# Patient Record
Sex: Female | Born: 1990
Health system: Southern US, Community
[De-identification: ages and names within clinical notes are randomized; demographics above are authoritative.]

## PROBLEM LIST (undated history)

## (undated) DIAGNOSIS — O418X90 Other specified disorders of amniotic fluid and membranes, unspecified trimester, not applicable or unspecified: Secondary | ICD-10-CM

## (undated) DIAGNOSIS — I951 Orthostatic hypotension: Secondary | ICD-10-CM

## (undated) DIAGNOSIS — G90A Postural orthostatic tachycardia syndrome (POTS): Secondary | ICD-10-CM

## (undated) DIAGNOSIS — O468X9 Other antepartum hemorrhage, unspecified trimester: Secondary | ICD-10-CM

## (undated) DIAGNOSIS — I498 Other specified cardiac arrhythmias: Secondary | ICD-10-CM

## (undated) DIAGNOSIS — E739 Lactose intolerance, unspecified: Secondary | ICD-10-CM

## (undated) DIAGNOSIS — N809 Endometriosis, unspecified: Secondary | ICD-10-CM

## (undated) DIAGNOSIS — G43909 Migraine, unspecified, not intractable, without status migrainosus: Secondary | ICD-10-CM

## (undated) DIAGNOSIS — R000 Tachycardia, unspecified: Secondary | ICD-10-CM

## (undated) DIAGNOSIS — M549 Dorsalgia, unspecified: Secondary | ICD-10-CM

## (undated) DIAGNOSIS — D649 Anemia, unspecified: Secondary | ICD-10-CM

## (undated) HISTORY — DX: Lactose intolerance, unspecified: E73.9

## (undated) HISTORY — PX: LAPAROSCOPIC OVARIAN CYSTECTOMY: SUR786

## (undated) HISTORY — DX: Other specified disorders of amniotic fluid and membranes, unspecified trimester, not applicable or unspecified: O41.8X90

## (undated) HISTORY — DX: Other antepartum hemorrhage, unspecified trimester: O46.8X9

## (undated) HISTORY — DX: Anemia, unspecified: D64.9

## (undated) HISTORY — DX: Postural orthostatic tachycardia syndrome (POTS): G90.A

## (undated) HISTORY — DX: Orthostatic hypotension: I95.1

## (undated) HISTORY — DX: Other specified cardiac arrhythmias: I49.8

## (undated) HISTORY — DX: Migraine, unspecified, not intractable, without status migrainosus: G43.909

## (undated) HISTORY — DX: Tachycardia, unspecified: R00.0

## (undated) HISTORY — PX: ENDOMETRIAL ABLATION: SHX621

## (undated) HISTORY — DX: Dorsalgia, unspecified: M54.9

---
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
## 2014-07-05 NOTE — L&D Delivery Note (Signed)
Patient was C/C/+1 and pushed for 56 minutes with epidural.   NSVD female infant, Apgars 8/9, weight pending.   The patient had a 1st degree laceration repaired with 3-0 vicryl. Fundus was firm. EBL was expected amount. Placenta was delivered intact. Vagina was clear.  Baby was vigorous and doing skin to skin with mother.  Jean AspenALLAHAN, Jean Phillips

## 2015-01-27 LAB — OB RESULTS CONSOLE RUBELLA ANTIBODY, IGM: RUBELLA: IMMUNE

## 2015-01-27 LAB — OB RESULTS CONSOLE RPR: RPR: NONREACTIVE

## 2015-01-27 LAB — OB RESULTS CONSOLE GC/CHLAMYDIA
CHLAMYDIA, DNA PROBE: NEGATIVE
GC PROBE AMP, GENITAL: NEGATIVE

## 2015-01-27 LAB — OB RESULTS CONSOLE ABO/RH: RH Type: POSITIVE

## 2015-01-27 LAB — OB RESULTS CONSOLE HIV ANTIBODY (ROUTINE TESTING): HIV: NONREACTIVE

## 2015-01-27 LAB — OB RESULTS CONSOLE HEPATITIS B SURFACE ANTIGEN: Hepatitis B Surface Ag: NEGATIVE

## 2015-01-27 LAB — OB RESULTS CONSOLE ANTIBODY SCREEN: Antibody Screen: NEGATIVE

## 2015-05-14 DIAGNOSIS — O468X9 Other antepartum hemorrhage, unspecified trimester: Secondary | ICD-10-CM

## 2015-05-14 DIAGNOSIS — O418X90 Other specified disorders of amniotic fluid and membranes, unspecified trimester, not applicable or unspecified: Secondary | ICD-10-CM | POA: Insufficient documentation

## 2015-05-14 DIAGNOSIS — I951 Orthostatic hypotension: Secondary | ICD-10-CM | POA: Insufficient documentation

## 2015-05-15 ENCOUNTER — Encounter: Payer: Self-pay | Admitting: Cardiovascular Disease

## 2015-05-15 ENCOUNTER — Ambulatory Visit (INDEPENDENT_AMBULATORY_CARE_PROVIDER_SITE_OTHER): Payer: Medicaid Other | Admitting: Cardiovascular Disease

## 2015-05-15 VITALS — BP 112/70 | HR 126 | Ht 63.5 in | Wt 205.0 lb

## 2015-05-15 DIAGNOSIS — I951 Orthostatic hypotension: Secondary | ICD-10-CM | POA: Diagnosis not present

## 2015-05-15 MED ORDER — LABETALOL HCL 100 MG PO TABS: 50.0000 mg | ORAL_TABLET | Freq: Two times a day (BID) | ORAL | Status: DC

## 2015-05-15 MED ORDER — METOPROLOL TARTRATE 25 MG PO TABS: 12.5000 mg | ORAL_TABLET | Freq: Two times a day (BID) | ORAL | Status: DC

## 2015-05-15 NOTE — Patient Instructions (Addendum)
Medication Instructions:  START Metoprolol 12.5 mg twice daily - take 12 hours apart - wait for phone call from our office before starting medication  **CALLED PATIENT BACK AFTER DR. Elease HashimotoNAHSER TALKED TO OB/GYN AND RX CHANGED TO LABETALOL 50 MG TWICE DAILY   Labwork: None Ordered   Testing/Procedures: None Ordered   Follow-Up: Your physician wants you to follow-up in: 6 months with Dr. Elease HashimotoNahser.  You will receive a reminder letter in the mail two months in advance. If you don't receive a letter, please call our office to schedule the follow-up appointment.  Your physician has requested that you regularly monitor and record your blood pressure readings at home. Please use the same machine at the same time of day to check your readings and call Marcelino DusterMichelle, RN to report    Thank you for choosing [facility]! Eligha BridegroomMichelle Violetta Lavalle, RN [phone]  If you need a refill on your cardiac medications before your next appointment, please call your pharmacy.

## 2015-05-15 NOTE — Progress Notes (Signed)
Cardiology Office Note   Date:  05/15/2015   ID:  Jean Phillips Roussel, DOB 1991/01/03, MRN [id number]  PCP:  No primary care provider on file.  Cardiologist:   Dyanne Yorks, Deloris PingPhilip J, MD   Chief Complaint  Patient presents with  . Tachycardia   Problem list 1. Sinus tachycardia-with pregnancy 2. POTS  - was well controlled on metoprolol .     History of Present Illness: Jean Phillips Deacon is a 24 y.o. female who presents for sinus tachycardia .  She is 8 months pregnant.  Has a hx of POTS syndrome.  Took metoprolol in the past with success.  Stopped   It July 2015 due to lack of insurance.   Otherwise doing great.  Some fatigue .   Was diagnosed with POTS in Dec. 2014.     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 Orthostatic hypotension   . Subchorionic hematoma     No past surgical history on file.   Current Outpatient Prescriptions  Medication Sig Dispense Refill  . Calcium Carbonate Antacid (TUMS PO) Take by mouth.    . Prenatal Vit-Fe Fumarate-FA (PRENATAL PO) Take by mouth.    . Pseudoephedrine HCl (SUDAFED PO) Take by mouth.     No current facility-administered medications for this visit.    Allergies:   Review of patient's allergies indicates no known allergies.    Social History:  The patient  reports that she has quit smoking. She does not have any smokeless tobacco history on file.   Family History:  The patient's family history includes Diabetes in her maternal grandfather and maternal grandmother; Hypertension in her father.    ROS:  Please see the history of present illness.    Review of Systems: Constitutional:  denies fever, chills, diaphoresis, appetite change and fatigue.  HEENT: denies photophobia, eye pain, redness, hearing loss, ear pain, congestion, sore throat, rhinorrhea, sneezing, neck pain, neck stiffness and tinnitus.  Respiratory: denies SOB, DOE,  cough, chest tightness, and wheezing.  Cardiovascular: denies chest pain, palpitations and leg swelling.  Gastrointestinal: denies nausea, vomiting, abdominal pain, diarrhea, constipation, blood in stool.  Genitourinary: denies dysuria, urgency, frequency, hematuria, flank pain and difficulty urinating.  Musculoskeletal: denies  myalgias, back pain, joint swelling, arthralgias and gait problem.   Skin: denies pallor, rash and wound.  Neurological: denies dizziness, seizures, syncope, weakness, light-headedness, numbness and headaches.   Hematological: denies adenopathy, easy bruising, personal or family bleeding history.  Psychiatric/ Behavioral: denies suicidal ideation, mood changes, confusion, nervousness, sleep disturbance and agitation.       All other systems are reviewed and negative.    PHYSICAL EXAM: VS:  BP 112/70 mmHg  Pulse 126  Ht 5' 3.5" (1.613 m)  Wt 205 lb (92.987 kg)  BMI 35.74 kg/m2 , BMI Body mass index is 35.74 kg/(m^2). GEN: Well nourished, well developed, in no acute distress HEENT: normal Neck: no JVD, carotid bruits, or masses Cardiac: RRR;  Tachycardic,  no murmurs, rubs, or gallops,no edema  Respiratory:  clear to auscultation bilaterally, normal work of breathing GI: soft, nontender, nondistended, + BS MS: no deformity or atrophy Skin: warm and dry, no rash Neuro:  Strength and sensation are intact Psych: normal   EKG:  EKG is ordered today. The ekg ordered today demonstrates sinus tach at 126.  No ST or T wave changes  Recent Labs: No results found for requested labs within last 365 days.    Lipid Panel No results found for: CHOL, TRIG, HDL, CHOLHDL, VLDL, LDLCALC, LDLDIRECT    Wt Readings from Last 3 Encounters:  05/15/15 205 lb (92.987 kg)      Other studies Reviewed: Additional studies/ records that were reviewed today include: . Review of the above records demonstrates:    ASSESSMENT AND PLAN:  1.  Sinus tachycardia:  Eeva  has a hx of POTS and now has tachycardia in her 8th month of pregnancy. I  I discussed with the OB - GYN doctor on call today. We discussed the options of metoprolol versus little low. Her OB/GYN would prefer that we try labetalol first since it has a longer track record in  Pregnancy. We'll start with Labetolol 50 mg twice a day. She will be seeing her OB doctor regularly    Current medicines are reviewed at length with the patient today.  The patient does not have concerns regarding medicines.  The following changes have been made:  no change  Labs/ tests ordered today include:  No orders of the defined types were placed in this encounter.     Disposition:   FU with me 6 months      Eura Radabaugh, Deloris Ping, MD  05/15/2015 9:20 AM    [facility] [address] [place], [place], [place]  [address] Phone: [phone]; Fax: [phone]   [facility]  [address] [place], [place]  [address] [phone]   Fax [phone]

## 2015-05-23 ENCOUNTER — Other Ambulatory Visit: Payer: Self-pay | Admitting: Obstetrics and Gynecology

## 2015-06-02 LAB — OB RESULTS CONSOLE GBS: STREP GROUP B AG: NEGATIVE

## 2015-06-18 ENCOUNTER — Inpatient Hospital Stay (EMERGENCY_DEPARTMENT_HOSPITAL)
Admission: AD | Admit: 2015-06-18 | Discharge: 2015-06-18 | Disposition: A | Discharge status: Home / Self Care | Payer: Medicaid Other | Source: Ambulatory Visit | Attending: Obstetrics and Gynecology | Admitting: Obstetrics and Gynecology

## 2015-06-18 ENCOUNTER — Encounter (HOSPITAL_COMMUNITY): Payer: Self-pay | Admitting: *Deleted

## 2015-06-18 DIAGNOSIS — O471 False labor at or after 37 completed weeks of gestation: Secondary | ICD-10-CM | POA: Diagnosis not present

## 2015-06-18 DIAGNOSIS — O479 False labor, unspecified: Secondary | ICD-10-CM

## 2015-06-18 HISTORY — DX: Endometriosis, unspecified: N80.9

## 2015-06-18 NOTE — MAU Note (Signed)
Had been having contractions but states that "they have stopped now"; had some vaginal leaking but unsure if its her bladder leaking or SROM;

## 2015-06-18 NOTE — MAU Provider Note (Signed)
Chief Complaint:  Labor Eval   None     HPI: Jean Phillips is a 24 y.o. G1P0 at 6646w5d who presents to maternity admissions reporting cramping yesterday and this morning that resolved but with onset of leakage of clear fluid, enough to soak a panty liner but not a pad this afternoon.  She passed her mucus plug yesterday and is seeing mucus discharge also, but some leakage is also watery.  Cramping/contractions were regular and painful, lasting several hours, but not worsening over time.  She did not try any medications for the discomfort and it was only mildly relieved with position change, drinking fluids, and warm shower.  The contractions resolved spontaneously this afternoon. She reports good fetal movement, denies vaginal bleeding, vaginal itching/burning, urinary symptoms, h/a, dizziness, n/v, or fever/chills.    HPI  Past Medical History: Past Medical History  Diagnosis Date  . Orthostatic hypotension   . Subchorionic hematoma   . Endometriosis     Past obstetric history: OB History  Gravida Para Term Preterm AB SAB TAB Ectopic Multiple Living  1             # Outcome Date GA Lbr Len/2nd Weight Sex Delivery Anes PTL Lv  1 Current               Past Surgical History: Past Surgical History  Procedure Laterality Date  . Laparoscopic ovarian cystectomy    . Endometrial ablation      Family History: Family History  Problem Relation Age of Onset  . Diabetes Maternal Grandmother   . Diabetes Maternal Grandfather   . Hypertension Father     Social History: Social History  Substance Use Topics  . Smoking status: Former Games developermoker  . Smokeless tobacco: None  . Alcohol Use: None    Allergies: No Known Allergies  Meds:  No prescriptions prior to admission    ROS:  Review of Systems  Constitutional: Negative for fever, chills and fatigue.  HENT: Negative for sinus pressure.   Eyes: Negative for photophobia.  Respiratory: Negative for shortness of breath.    Cardiovascular: Negative for chest pain.  Gastrointestinal: Negative for nausea, vomiting, diarrhea and constipation.  Genitourinary: Negative for dysuria, frequency, flank pain, vaginal bleeding, vaginal discharge, difficulty urinating, vaginal pain and pelvic pain.  Musculoskeletal: Negative for neck pain.  Neurological: Negative for dizziness, weakness and headaches.  Psychiatric/Behavioral: Negative.      I have reviewed patient's Past Medical Hx, Surgical Hx, Family Hx, Social Hx, medications and allergies.   Physical Exam   Patient Vitals for the past 24 hrs:  BP Temp Temp src Pulse Resp  06/18/15 1729 125/75 mmHg - - 96 18  06/18/15 1640 137/88 mmHg 97.7 F (36.5 C) Oral (!) 127 18   Constitutional: Well-developed, well-nourished female in no acute distress.  Cardiovascular: normal rate Respiratory: normal effort GI: Abd soft, non-tender, gravid appropriate for gestational age.  MS: Extremities nontender, no edema, normal ROM Neurologic: Alert and oriented x 4.  GU: Neg CVAT.  PELVIC EXAM: Cervix pink, visually closed, without lesion, large amount milky white thick fluid, no pooling of liquid, slide taken for ferning, vaginal walls and external genitalia normal  Dilation: 1 Effacement (%): 60 Station: -3 Presentation: Vertex Exam by:: L Leftwich-Kirby CNM   Ferning slide negative  FHT:  Baseline 135, moderate variability, accelerations present, no decelerations Contractions: Rare on toco, mild to palpation   Labs: No results found for this or any previous visit (from the past 24 hour(s)).  Imaging:  No results found.  MAU Course/MDM: I have ordered labs and reviewed results.  Reviewed FHR tracing.  No evidence of ruptured membranes or active labor on today's exam.  Consult Dr Tenny Craw.  Term labor precautions given. Pt stable at time of discharge.  Assessment: 1. Threatened labor at term     Plan: Discharge home Labor precautions and fetal kick counts      Follow-up Information    Follow up with Almon Hercules., MD.   Specialty:  Obstetrics and Gynecology   Why:  As scheduled, Return to MAU as needed for emergencies   Contact information:   [address] [place] [address] [phone]        Medication List    STOP taking these medications        labetalol 100 MG tablet  Commonly known as:  NORMODYNE      TAKE these medications        guaifenesin 100 MG/5ML syrup  Commonly known as:  ROBITUSSIN  Take 200 mg by mouth 3 (three) times daily as needed for cough.     PRENATAL PO  Take by mouth.     TUMS PO  Take 2 tablets by mouth as needed (heartburn).        Sharen Counter Certified Nurse-Midwife 06/18/2015 6:21 PM

## 2015-06-18 NOTE — MAU Note (Signed)
Cramping started 2 nights ago, became more regular yesterday, still cramping today.  Has ? LOF, dripping occasionally, clear fluid.  Denies bleeding.

## 2015-06-18 NOTE — Discharge Instructions (Signed)

## 2015-06-19 ENCOUNTER — Inpatient Hospital Stay (HOSPITAL_COMMUNITY)
Admission: AD | Admit: 2015-06-19 | Discharge: 2015-06-21 | DRG: 775 | Disposition: A | Discharge status: Home / Self Care | Payer: Medicaid Other | Source: Ambulatory Visit | Attending: Obstetrics and Gynecology | Admitting: Obstetrics and Gynecology

## 2015-06-19 ENCOUNTER — Inpatient Hospital Stay (HOSPITAL_COMMUNITY): Payer: Medicaid Other | Admitting: Anesthesiology

## 2015-06-19 ENCOUNTER — Encounter (HOSPITAL_COMMUNITY): Payer: Self-pay | Admitting: *Deleted

## 2015-06-19 DIAGNOSIS — Z87891 Personal history of nicotine dependence: Secondary | ICD-10-CM

## 2015-06-19 DIAGNOSIS — Z6837 Body mass index (BMI) 37.0-37.9, adult: Secondary | ICD-10-CM | POA: Diagnosis not present

## 2015-06-19 DIAGNOSIS — Z3A39 39 weeks gestation of pregnancy: Secondary | ICD-10-CM | POA: Diagnosis not present

## 2015-06-19 DIAGNOSIS — O99214 Obesity complicating childbirth: Secondary | ICD-10-CM | POA: Diagnosis present

## 2015-06-19 LAB — CBC
HCT: 39.1 % (ref 36.0–46.0)
Hemoglobin: 13.3 g/dL (ref 12.0–15.0)
MCH: 30.7 pg (ref 26.0–34.0)
MCHC: 34 g/dL (ref 30.0–36.0)
MCV: 90.3 fL (ref 78.0–100.0)
Platelets: 279 10*3/uL (ref 150–400)
RBC: 4.33 MIL/uL (ref 3.87–5.11)
RDW: 14.3 % (ref 11.5–15.5)
WBC: 15.7 10*3/uL — ABNORMAL HIGH (ref 4.0–10.5)

## 2015-06-19 LAB — TYPE AND SCREEN
ABO/RH(D): A POS
Antibody Screen: NEGATIVE

## 2015-06-19 LAB — RPR: RPR Ser Ql: NONREACTIVE

## 2015-06-19 LAB — POCT FERN TEST: POCT FERN TEST: POSITIVE

## 2015-06-19 LAB — ABO/RH: ABO/RH(D): A POS

## 2015-06-19 MED ORDER — PRENATAL MULTIVITAMIN CH
1.0000 | ORAL_TABLET | Freq: Every day | ORAL | Status: DC
  Administered 2015-06-20 – 2015-06-21 (×2): 1 via ORAL
  Filled 2015-06-19 (×2): qty 1

## 2015-06-19 MED ORDER — SENNOSIDES-DOCUSATE SODIUM 8.6-50 MG PO TABS
2.0000 | ORAL_TABLET | ORAL | Status: DC
  Administered 2015-06-19 – 2015-06-20 (×2): 2 via ORAL
  Filled 2015-06-19 (×2): qty 2

## 2015-06-19 MED ORDER — FLEET ENEMA 7-19 GM/118ML RE ENEM: 1.0000 | ENEMA | RECTAL | Status: DC | PRN

## 2015-06-19 MED ORDER — PHENYLEPHRINE 40 MCG/ML (10ML) SYRINGE FOR IV PUSH (FOR BLOOD PRESSURE SUPPORT)
80.0000 ug | PREFILLED_SYRINGE | INTRAVENOUS | Status: DC | PRN
  Filled 2015-06-19: qty 20
  Filled 2015-06-19: qty 2

## 2015-06-19 MED ORDER — FENTANYL 2.5 MCG/ML BUPIVACAINE 1/10 % EPIDURAL INFUSION (WH - ANES): 14.0000 mL/h | INTRAMUSCULAR | Status: DC | PRN

## 2015-06-19 MED ORDER — ONDANSETRON HCL 4 MG/2ML IJ SOLN
4.0000 mg | Freq: Four times a day (QID) | INTRAMUSCULAR | Status: DC | PRN
Start: 2015-06-19 — End: 2015-06-19
  Administered 2015-06-19: 4 mg via INTRAVENOUS
  Filled 2015-06-19: qty 2

## 2015-06-19 MED ORDER — OXYCODONE-ACETAMINOPHEN 5-325 MG PO TABS: 2.0000 | ORAL_TABLET | ORAL | Status: DC | PRN

## 2015-06-19 MED ORDER — ONDANSETRON HCL 4 MG PO TABS: 4.0000 mg | ORAL_TABLET | ORAL | Status: DC | PRN

## 2015-06-19 MED ORDER — LACTATED RINGERS IV SOLN
500.0000 mL | INTRAVENOUS | Status: DC | PRN
  Administered 2015-06-19 (×2): 500 mL via INTRAVENOUS

## 2015-06-19 MED ORDER — DIBUCAINE 1 % RE OINT: 1.0000 "application " | TOPICAL_OINTMENT | RECTAL | Status: DC | PRN

## 2015-06-19 MED ORDER — EPHEDRINE 5 MG/ML INJ
10.0000 mg | INTRAVENOUS | Status: DC | PRN
  Filled 2015-06-19: qty 2

## 2015-06-19 MED ORDER — SIMETHICONE 80 MG PO CHEW: 80.0000 mg | CHEWABLE_TABLET | ORAL | Status: DC | PRN

## 2015-06-19 MED ORDER — OXYCODONE-ACETAMINOPHEN 5-325 MG PO TABS: 1.0000 | ORAL_TABLET | ORAL | Status: DC | PRN

## 2015-06-19 MED ORDER — OXYTOCIN BOLUS FROM INFUSION
500.0000 mL | INTRAVENOUS | Status: DC
  Administered 2015-06-19: 500 mL via INTRAVENOUS

## 2015-06-19 MED ORDER — FENTANYL 2.5 MCG/ML BUPIVACAINE 1/10 % EPIDURAL INFUSION (WH - ANES)
14.0000 mL/h | INTRAMUSCULAR | Status: DC | PRN
  Administered 2015-06-19: 14 mL/h via EPIDURAL
  Filled 2015-06-19 (×2): qty 125

## 2015-06-19 MED ORDER — ONDANSETRON HCL 4 MG/2ML IJ SOLN: 4.0000 mg | INTRAMUSCULAR | Status: DC | PRN

## 2015-06-19 MED ORDER — FENTANYL 2.5 MCG/ML BUPIVACAINE 1/10 % EPIDURAL INFUSION (WH - ANES)
INTRAMUSCULAR | Status: DC | PRN
  Administered 2015-06-19: 14 mL/h via EPIDURAL

## 2015-06-19 MED ORDER — LACTATED RINGERS IV SOLN
INTRAVENOUS | Status: DC
  Administered 2015-06-19 (×2): via INTRAVENOUS

## 2015-06-19 MED ORDER — ZOLPIDEM TARTRATE 5 MG PO TABS: 5.0000 mg | ORAL_TABLET | Freq: Every evening | ORAL | Status: DC | PRN

## 2015-06-19 MED ORDER — OXYTOCIN 40 UNITS IN LACTATED RINGERS INFUSION - SIMPLE MED
62.5000 mL/h | INTRAVENOUS | Status: DC
  Filled 2015-06-19: qty 1000

## 2015-06-19 MED ORDER — BENZOCAINE-MENTHOL 20-0.5 % EX AERO
1.0000 "application " | INHALATION_SPRAY | CUTANEOUS | Status: DC | PRN
  Administered 2015-06-19 – 2015-06-21 (×2): 1 via TOPICAL
  Filled 2015-06-19 (×2): qty 56

## 2015-06-19 MED ORDER — DIPHENHYDRAMINE HCL 25 MG PO CAPS: 25.0000 mg | ORAL_CAPSULE | Freq: Four times a day (QID) | ORAL | Status: DC | PRN

## 2015-06-19 MED ORDER — INFLUENZA VAC SPLIT QUAD 0.5 ML IM SUSY
0.5000 mL | PREFILLED_SYRINGE | INTRAMUSCULAR | Status: AC
  Administered 2015-06-20: 0.5 mL via INTRAMUSCULAR
  Filled 2015-06-19: qty 0.5

## 2015-06-19 MED ORDER — WITCH HAZEL-GLYCERIN EX PADS: 1.0000 "application " | MEDICATED_PAD | CUTANEOUS | Status: DC | PRN

## 2015-06-19 MED ORDER — CITRIC ACID-SODIUM CITRATE 334-500 MG/5ML PO SOLN: 30.0000 mL | ORAL | Status: DC | PRN

## 2015-06-19 MED ORDER — LIDOCAINE HCL (PF) 1 % IJ SOLN
30.0000 mL | INTRAMUSCULAR | Status: DC | PRN
  Administered 2015-06-19: 30 mL via SUBCUTANEOUS
  Filled 2015-06-19: qty 30

## 2015-06-19 MED ORDER — LIDOCAINE HCL (PF) 1 % IJ SOLN
INTRAMUSCULAR | Status: DC | PRN
  Administered 2015-06-19 (×2): 5 mL

## 2015-06-19 MED ORDER — LIDOCAINE HCL (PF) 1 % IJ SOLN: INTRAMUSCULAR | Status: DC | PRN

## 2015-06-19 MED ORDER — TETANUS-DIPHTH-ACELL PERTUSSIS 5-2.5-18.5 LF-MCG/0.5 IM SUSP: 0.5000 mL | Freq: Once | INTRAMUSCULAR | Status: DC

## 2015-06-19 MED ORDER — ACETAMINOPHEN 325 MG PO TABS: 650.0000 mg | ORAL_TABLET | ORAL | Status: DC | PRN

## 2015-06-19 MED ORDER — IBUPROFEN 600 MG PO TABS
600.0000 mg | ORAL_TABLET | Freq: Four times a day (QID) | ORAL | Status: DC
  Administered 2015-06-19 – 2015-06-21 (×8): 600 mg via ORAL
  Filled 2015-06-19 (×8): qty 1

## 2015-06-19 MED ORDER — DIPHENHYDRAMINE HCL 50 MG/ML IJ SOLN: 12.5000 mg | INTRAMUSCULAR | Status: DC | PRN

## 2015-06-19 MED ORDER — LANOLIN HYDROUS EX OINT: TOPICAL_OINTMENT | CUTANEOUS | Status: DC | PRN

## 2015-06-19 NOTE — Anesthesia Procedure Notes (Signed)
Epidural Patient location during procedure: OB Start time: 06/19/2015 7:41 AM End time: 06/19/2015 7:59 AM  Staffing Anesthesiologist: Sebastian AcheMANNY, Cathren Sween  Preanesthetic Checklist Completed: patient identified, site marked, surgical consent, pre-op evaluation, timeout performed, IV checked, risks and benefits discussed and monitors and equipment checked  Epidural Patient position: sitting Prep: site prepped and draped and DuraPrep Patient monitoring: heart rate, continuous pulse ox and blood pressure Approach: midline Location: L3-L4 Injection technique: LOR air  Needle:  Needle type: Tuohy  Needle gauge: 17 G Needle length: 9 cm and 9 Needle insertion depth: 6 cm Catheter type: closed end flexible Catheter size: 19 Gauge Catheter at skin depth: 14 cm Test dose: negative  Assessment Events: blood not aspirated, injection not painful, no injection resistance, negative IV test and no paresthesia  Additional Notes   Patient tolerated the insertion well without complications.Reason for block:procedure for pain

## 2015-06-19 NOTE — H&P (Signed)
24 y.o. 7440w6d  G1P0 comes in c/o SROM approx 3:45am today.  Otherwise has good fetal movement and no bleeding.  Past Medical History  Diagnosis Date  . Orthostatic hypotension   . Subchorionic hematoma   . Endometriosis     Past Surgical History  Procedure Laterality Date  . Laparoscopic ovarian cystectomy    . Endometrial ablation      OB History  Gravida Para Term Preterm AB SAB TAB Ectopic Multiple Living  1             # Outcome Date GA Lbr Len/2nd Weight Sex Delivery Anes PTL Lv  1 Current               Social History   Social History  . Marital Status: Married    Spouse Name: N/A  . Number of Children: N/A  . Years of Education: N/A   Occupational History  . Not on file.   Social History Main Topics  . Smoking status: Former Games developermoker  . Smokeless tobacco: Not on file  . Alcohol Use: No  . Drug Use: No  . Sexual Activity: Not on file   Other Topics Concern  . Not on file   Social History Narrative   Review of patient's allergies indicates no known allergies.    Prenatal Transfer Tool  Maternal Diabetes: No Genetic Screening: Normal Maternal Ultrasounds/Referrals: Normal Fetal Ultrasounds or other Referrals:  None Maternal Substance Abuse:  No Significant Maternal Medications:  None Significant Maternal Lab Results: Lab values include: Group B Strep negative  Other PNC: POTS, s/p cards eval while pregnant, declined starting labetalol 50bid   Filed Vitals:   06/19/15 0817 06/19/15 0832  BP: 124/93 123/82  Pulse: 108 97  Temp:    Resp:  16     Lungs/Cor:  NAD Abdomen:  soft, gravid Ex:  no cords, erythema SVE:  4.5/80/02 FHTs:  135, good STV, NST R Toco:  q2-4  A/P   Admit in labor  GBS Neg  Epidural as desired  Routine care  [place]AN, Luther ParodySIDNEY

## 2015-06-19 NOTE — Lactation Note (Signed)
This note was copied from the chart of Boy Cathe MonsSarah [place]. Lactation Consultation Note  Patient Name: Boy Cathe MonsSarah Cambridge EXBMW'UToday's Date: 06/19/2015 Reason for consult: Initial assessment Baby at 6 hr of life and mom reports bf is going well. Discussed baby behavior, feeding frequency, baby belly size, voids, wt loss, breast changes, and nipple care. Demonstrated manual expression, but mom may need more teaching. Given lactation handouts. Aware of OP services and support group.     Maternal Data Has patient been taught Hand Expression?: Yes Does the patient have breastfeeding experience prior to this delivery?: No  Feeding Feeding Type: Breast Fed Length of feed: 15 min  LATCH Score/Interventions Latch: Repeated attempts needed to sustain latch, nipple held in mouth throughout feeding, stimulation needed to elicit sucking reflex. Intervention(s): Breast compression  Audible Swallowing: Spontaneous and intermittent  Type of Nipple: Everted at rest and after stimulation  Comfort (Breast/Nipple): Soft / non-tender     Hold (Positioning): No assistance needed to correctly position infant at breast. Intervention(s): Support Pillows;Skin to skin  LATCH Score: 9  Lactation Tools Discussed/Used WIC Program: Yes   Consult Status Consult Status: Follow-up Date: 06/20/15 Follow-up type: In-patient    Rulon Eisenmengerlizabeth E Marlicia Sroka 06/19/2015, 9:05 PM

## 2015-06-19 NOTE — MAU Note (Signed)
Pt states water broke at 0345-has continued to leak out since. Clear fluid. Contractions every 2-3 mins apart. +FM tonight. Denies vag bleeding

## 2015-06-19 NOTE — Anesthesia Preprocedure Evaluation (Signed)
Anesthesia Evaluation  Patient identified by MRN, date of birth, ID band Patient awake    Reviewed: Allergy & Precautions, NPO status , Patient's Chart, lab work & pertinent test results  Airway Mallampati: II   Neck ROM: Full    Dental  (+) Teeth Intact   Pulmonary neg pulmonary ROS, former smoker,    breath sounds clear to auscultation       Cardiovascular negative cardio ROS   Rhythm:Irregular     Neuro/Psych negative neurological ROS  negative psych ROS   GI/Hepatic negative GI ROS, Neg liver ROS,   Endo/Other  negative endocrine ROSMorbid obesityBMI 36  Renal/GU negative Renal ROS  negative genitourinary   Musculoskeletal negative musculoskeletal ROS (+)   Abdominal (+)  Abdomen: soft.    Peds negative pediatric ROS (+)  Hematology negative hematology ROS (+)   Anesthesia Other Findings   Reproductive/Obstetrics negative OB ROS (+) Pregnancy                             Anesthesia Physical Anesthesia Plan  ASA: II  Anesthesia Plan: Epidural   Post-op Pain Management:    Induction:   Airway Management Planned:   Additional Equipment:   Intra-op Plan:   Post-operative Plan:   Informed Consent: I have reviewed the patients History and Physical, chart, labs and discussed the procedure including the risks, benefits and alternatives for the proposed anesthesia with the patient or authorized representative who has indicated his/her understanding and acceptance.     Plan Discussed with:   Anesthesia Plan Comments:         Anesthesia Quick Evaluation

## 2015-06-20 LAB — CBC
HCT: 32.8 % — ABNORMAL LOW (ref 36.0–46.0)
Hemoglobin: 10.8 g/dL — ABNORMAL LOW (ref 12.0–15.0)
MCH: 30.3 pg (ref 26.0–34.0)
MCHC: 32.9 g/dL (ref 30.0–36.0)
MCV: 92.1 fL (ref 78.0–100.0)
PLATELETS: 221 10*3/uL (ref 150–400)
RBC: 3.56 MIL/uL — ABNORMAL LOW (ref 3.87–5.11)
RDW: 14.6 % (ref 11.5–15.5)
WBC: 13.8 10*3/uL — AB (ref 4.0–10.5)

## 2015-06-20 NOTE — Anesthesia Postprocedure Evaluation (Signed)
Anesthesia Post Note  Patient: Jean Phillips  Procedure(s) Performed: * No procedures listed *  Patient location during evaluation: Mother Baby Anesthesia Type: Epidural Level of consciousness: awake, awake and alert, oriented and patient cooperative Pain management: pain level controlled Vital Signs Assessment: post-procedure vital signs reviewed and stable Respiratory status: spontaneous breathing, nonlabored ventilation and respiratory function stable Cardiovascular status: stable Postop Assessment: no headache, no backache, patient able to bend at knees and no signs of nausea or vomiting Anesthetic complications: no    Last Vitals:  Filed Vitals:   06/19/15 2126 06/20/15 0509  BP: 119/65 110/63  Pulse: 91 85  Temp: 36.7 C 36.6 C  Resp: 18 20    Last Pain:  Filed Vitals:   06/20/15 0509  PainSc: 4                  Nakyia Dau L

## 2015-06-20 NOTE — Lactation Note (Signed)
This note was copied from the chart of Jean Phillips Wiesen. Lactation Consultation Note  Patient Name: Jean Phillips Leidy ZOXWR'UToday's Date: 06/20/2015 Reason for consult: Follow-up assessment Baby at 24 hr of life and mom reports that he has not been feeding well. Had mom unwrap baby and get him sts in the football position. Baby was go on the breast and mom would state that he was pinching. Got baby to breast comfortably in a laid back position. Baby was on the breast for 20 minutes with audible swallows. Discussed baby behavior, feeding frequency, baby belly size, voids, breast changes, and nipple care. She will offer the breast 8+/24hr and call for help as needed. She is aware of OP services and support group.    Maternal Data    Feeding Feeding Type: Breast Fed Length of feed: 20 min  LATCH Score/Interventions Latch: Grasps breast easily, tongue down, lips flanged, rhythmical sucking. Intervention(s): Adjust position;Assist with latch;Breast compression  Audible Swallowing: Spontaneous and intermittent  Type of Nipple: Everted at rest and after stimulation  Comfort (Breast/Nipple): Soft / non-tender     Hold (Positioning): Assistance needed to correctly position infant at breast and maintain latch. Intervention(s): Support Pillows;Position options  LATCH Score: 9  Lactation Tools Discussed/Used     Consult Status Consult Status: Follow-up Date: 06/21/15 Follow-up type: In-patient    Rulon Eisenmengerlizabeth E Trampus Mcquerry 06/20/2015, 4:15 PM

## 2015-06-20 NOTE — Progress Notes (Signed)
Patient is eating, ambulating, voiding.  Pain control is good.  Filed Vitals:   06/19/15 1715 06/19/15 1823 06/19/15 2126 06/20/15 0509  BP: 131/67 127/74 119/65 110/63  Pulse: 124 108 91 85  Temp: 98.2 F (36.8 C) 98.3 F (36.8 C) 98 F (36.7 C) 97.8 F (36.6 C)  TempSrc: Oral Oral    Resp: 20 20 18 20   Height:      Weight:      SpO2:        Fundus firm Perineum without swelling.  Lab Results  Component Value Date   WBC 13.8* 06/20/2015   HGB 10.8* 06/20/2015   HCT 32.8* 06/20/2015   MCV 92.1 06/20/2015   PLT 221 06/20/2015    --/--/A POS, A POS (12/15 0546)/RI  A/P Post partum day 1.  Routine care.  Expect d/c routine.    Evant Shellhammer A

## 2015-06-20 NOTE — Discharge Summary (Signed)
Obstetric Discharge Summary Reason for Admission: onset of labor Prenatal Procedures: none Intrapartum Procedures: spontaneous vaginal delivery Postpartum Procedures: none Complications-Operative and Postpartum: first degree perineal laceration HEMOGLOBIN  Date Value Ref Range Status  06/20/2015 10.8* 12.0 - 15.0 g/dL Final   HCT  Date Value Ref Range Status  06/20/2015 32.8* 36.0 - 46.0 % Final   Discharge Diagnoses: Term Pregnancy-delivered  Discharge Information: Date: 06/20/2015 Activity: pelvic rest Diet: routine Medications: Ibuprofen Condition: stable Instructions: refer to practice specific booklet Discharge to: home Follow-up Information    Follow up with CALLAHAN, SIDNEY, DO In 4 weeks.   Specialty:  Obstetrics and Gynecology   Contact information:   [address] [place] [address] [phone]       Newborn Data: Live born female  Birth Weight: 8 lb 4.8 oz (3765 g) APGAR: 8, 9  Home with mother.  Whitt Auletta A 06/20/2015, 8:06 AM

## 2015-06-21 NOTE — Progress Notes (Signed)
Discharge education complete, discharge instructions and follow up appointment discussed. Patient verbalized understanding. 

## 2015-06-21 NOTE — Progress Notes (Signed)
Patient is eating, ambulating, voiding.  Pain control is good.  Filed Vitals:   06/19/15 2126 06/20/15 0509 06/20/15 2007 06/21/15 0530  BP: 119/65 110/63 133/80 134/84  Pulse: 91 85 100 116  Temp: 98 F (36.7 C) 97.8 F (36.6 C) 98.2 F (36.8 C) 98 F (36.7 C)  TempSrc:   Oral Oral  Resp: 18 20 18 18   Height:      Weight:      SpO2:        Fundus firm Perineum without swelling.  Lab Results  Component Value Date   WBC 13.8* 06/20/2015   HGB 10.8* 06/20/2015   HCT 32.8* 06/20/2015   MCV 92.1 06/20/2015   PLT 221 06/20/2015    --/--/A POS, A POS (12/15 0546)/RI  A/P Post partum day 2.  Routine care.  Expect d/c today after circ- Parents desires circumsision.  All risks, benefits and alternatives discussed with the mother.Marland Kitchen.    Saphyra Hutt A

## 2015-10-15 MED FILL — NORETHINDRONE 0.35 MG TAB: 0.35 | 28 days supply | Qty: 28 | Fill #0

## 2015-10-18 ENCOUNTER — Ambulatory Visit (HOSPITAL_COMMUNITY)
Admission: EM | Admit: 2015-10-18 | Discharge: 2015-10-18 | Disposition: A | Discharge status: Home / Self Care | Payer: 59 | Attending: Emergency Medicine | Admitting: Emergency Medicine

## 2015-10-18 ENCOUNTER — Encounter (HOSPITAL_COMMUNITY): Payer: Self-pay | Admitting: *Deleted

## 2015-10-18 DIAGNOSIS — L292 Pruritus vulvae: Secondary | ICD-10-CM | POA: Insufficient documentation

## 2015-10-18 DIAGNOSIS — Z9889 Other specified postprocedural states: Secondary | ICD-10-CM | POA: Insufficient documentation

## 2015-10-18 DIAGNOSIS — Z87891 Personal history of nicotine dependence: Secondary | ICD-10-CM | POA: Insufficient documentation

## 2015-10-18 DIAGNOSIS — N809 Endometriosis, unspecified: Secondary | ICD-10-CM | POA: Insufficient documentation

## 2015-10-18 DIAGNOSIS — Z79899 Other long term (current) drug therapy: Secondary | ICD-10-CM | POA: Insufficient documentation

## 2015-10-18 DIAGNOSIS — R300 Dysuria: Secondary | ICD-10-CM | POA: Insufficient documentation

## 2015-10-18 LAB — POCT URINALYSIS DIP (DEVICE)
BILIRUBIN URINE: NEGATIVE
GLUCOSE, UA: NEGATIVE mg/dL
Hgb urine dipstick: NEGATIVE
Ketones, ur: NEGATIVE mg/dL
NITRITE: NEGATIVE
PROTEIN: NEGATIVE mg/dL
Specific Gravity, Urine: 1.015 (ref 1.005–1.030)
UROBILINOGEN UA: 0.2 mg/dL (ref 0.0–1.0)
pH: 7 (ref 5.0–8.0)

## 2015-10-18 MED ORDER — CEPHALEXIN 250 MG PO CAPS: 250.0000 mg | ORAL_CAPSULE | Freq: Three times a day (TID) | ORAL | Status: DC

## 2015-10-18 MED ORDER — PHENAZOPYRIDINE HCL 200 MG PO TABS: 200.0000 mg | ORAL_TABLET | Freq: Three times a day (TID) | ORAL | Status: DC | PRN

## 2015-10-18 NOTE — ED Notes (Signed)
Pt  Reports symptoms  Of urinary  Frequency          Foul  Odor    As  Well  As   A  Darker  Appearance  Of her urine         Symptoms  X  5  Days

## 2015-10-18 NOTE — Discharge Instructions (Signed)

## 2015-10-18 NOTE — ED Provider Notes (Signed)
CSN: [id number]     Arrival date & time 10/18/15  1316 History   None    No chief complaint on file.  (Consider location/radiation/quality/duration/timing/severity/associated sxs/prior Treatment) HPI History obtained from patient:   I have a urinary tract infection   2 days ago began to have  "funky smell" to once morning urine, then discomfort urinating during the day.  occasional vaginal itching.  No relief from increased fluids, cranberry juice. Denies fever/chills. Similar  history of urinary symptoms during pregnancy.  Past Medical History  Diagnosis Date  . Orthostatic hypotension   . Subchorionic hematoma   . Endometriosis    Past Surgical History  Procedure Laterality Date  . Laparoscopic ovarian cystectomy    . Endometrial ablation     Family History  Problem Relation Age of Onset  . Diabetes Maternal Grandmother   . Diabetes Maternal Grandfather   . Hypertension Father    Social History  Substance Use Topics  . Smoking status: Former Games developermoker  . Smokeless tobacco: Not on file  . Alcohol Use: No   OB History    Gravida Para Term Preterm AB TAB SAB Ectopic Multiple Living   1 1 1       0 1     Review of Systems Urinary symptoms Allergies  Review of patient's allergies indicates no known allergies.  Home Medications   Prior to Admission medications   Medication Sig Start Date End Date Taking? Authorizing Provider  Calcium Carbonate Antacid (TUMS PO) Take 2 tablets by mouth as needed (heartburn).     Historical Provider, MD  guaifenesin (ROBITUSSIN) 100 MG/5ML syrup Take 200 mg by mouth 3 (three) times daily as needed for cough.    Historical Provider, MD  labetalol (NORMODYNE) 100 MG tablet Take 0.5 tablets (50 mg total) by mouth 2 (two) times daily. Patient not taking: Reported on 06/18/2015 05/15/15   Vesta MixerPhilip J Nahser, MD  Prenatal Vit-Fe Fumarate-FA (PRENATAL PO) Take by mouth.    Historical Provider, MD   Meds Ordered and Administered this Visit   Medications - No data to display  BP 101/64 mmHg  Pulse 67  Temp(Src) 98.1 F (36.7 C) (Oral)  SpO2 98% No data found.   Physical Exam NURSES NOTES AND VITAL SIGNS REVIEWED. CONSTITUTIONAL: Well developed, well nourished, no acute distress HEENT: normocephalic, atraumatic EYES: Conjunctiva normal NECK:normal ROM, supple, no adenopathy PULMONARY:No respiratory distress, normal effort MUSCULOSKELETAL: Normal ROM of all extremities,  SKIN: warm and dry without rash PSYCHIATRIC: Mood and affect, behavior are normal  ED Course  Procedures (including critical care time)  Labs Review Labs Reviewed  POCT URINALYSIS DIP (DEVICE) - Abnormal; Notable for the following:    Leukocytes, UA SMALL (*)    All other components within normal limits    Imaging Review No results found.   Visual Acuity Review  Right Eye Distance:   Left Eye Distance:   Bilateral Distance:    Right Eye Near:   Left Eye Near:    Bilateral Near:     Discussed with patient that urine is no convincing of UTI, she would like antibx. Option is given to await culture results.  Breast feeding Rx keflex and pyridium.    MDM   1. Dysuria     Patient is reassured that there are no issues that require transfer to higher level of care at this time or additional tests. Patient is advised to continue home symptomatic treatment. Patient is advised that if there are new or worsening symptoms  to attend the emergency department, contact primary care provider, or return to UC. Instructions of care provided discharged home in stable condition.    THIS NOTE WAS GENERATED USING A VOICE RECOGNITION SOFTWARE PROGRAM. ALL REASONABLE EFFORTS  WERE MADE TO PROOFREAD THIS DOCUMENT FOR ACCURACY.  I have verbally reviewed the discharge instructions with the patient. A printed AVS was given to the patient.  All questions were answered prior to discharge.      Tharon Aquas, PA 10/18/15 1535

## 2015-10-20 LAB — URINE CULTURE
Culture: >=100000 — AB
SPECIAL REQUESTS: NORMAL

## 2015-10-22 ENCOUNTER — Telehealth: Payer: Self-pay | Admitting: Internal Medicine

## 2015-10-22 ENCOUNTER — Telehealth (HOSPITAL_COMMUNITY): Payer: Self-pay | Admitting: Emergency Medicine

## 2015-10-22 DIAGNOSIS — N390 Urinary tract infection, site not specified: Secondary | ICD-10-CM

## 2015-10-22 MED ORDER — NITROFURANTOIN MONOHYD MACRO 100 MG PO CAPS: 100.0000 mg | ORAL_CAPSULE | Freq: Two times a day (BID) | ORAL | Status: AC

## 2015-10-22 NOTE — ED Notes (Signed)
LM on pt's VM [phone] Need to give lab results from recent visit on 4/15 Also let pt know labs can be obtained from MyChart... Also left general message that she [facility]  Per Dr. Dayton ScrapeMurray,  Clinical staff, please let patient know that urine cx is growing E coli resistant to cephalexin, need to change antibiotic. [facility] Prescription for nitrofurantoin was sent to the pharmacy of record, [facility] on [address].  Recheck for persistent urinary symptoms. LM

## 2015-10-22 NOTE — ED Notes (Signed)
Please let patient know that urine cx is growing E coli resistant to cephalexin, need to change antibiotic. [facility] Prescription for nitrofurantoin was sent to the pharmacy of record, [facility] on [address].   Recheck for persistent urinary symptoms.  LM  Eustace MooreLaura W Esperansa Sarabia, MD 10/22/15 [phone]

## 2015-11-10 MED FILL — NORETHINDRONE 0.35 MG TAB: 0.35 | 28 days supply | Qty: 28 | Fill #1

## 2015-11-15 ENCOUNTER — Telehealth: Payer: 59 | Admitting: Family

## 2015-11-15 DIAGNOSIS — J069 Acute upper respiratory infection, unspecified: Secondary | ICD-10-CM

## 2015-11-15 DIAGNOSIS — B9689 Other specified bacterial agents as the cause of diseases classified elsewhere: Secondary | ICD-10-CM

## 2015-11-15 MED ORDER — AMOXICILLIN-POT CLAVULANATE 875-125 MG PO TABS: 1.0000 | ORAL_TABLET | Freq: Two times a day (BID) | ORAL | Status: AC

## 2015-11-15 NOTE — Progress Notes (Signed)

## 2015-11-18 ENCOUNTER — Telehealth: Payer: Self-pay | Admitting: General Practice

## 2015-11-18 NOTE — Telephone Encounter (Signed)
Relation to GM:WNUUpt:self Call back number:[phone] Pharmacy:  Reason for call:  Dolinsky,Danna N patient of Dr.Blyth referred her daughter to establish care with you, please advise

## 2015-11-18 NOTE — Telephone Encounter (Signed)
I will take her as a patient just find out how urgent the appointment needs to be so we can work out a time.tiffany

## 2015-11-19 NOTE — Telephone Encounter (Signed)
Patient scheduled for 03/05/2016 at 2:30pm as of right now no urgent concerns. Thank You

## 2015-11-25 DIAGNOSIS — M5136 Other intervertebral disc degeneration, lumbar region: Secondary | ICD-10-CM | POA: Diagnosis not present

## 2015-11-25 DIAGNOSIS — M545 Low back pain: Secondary | ICD-10-CM | POA: Diagnosis not present

## 2015-11-25 DIAGNOSIS — M9903 Segmental and somatic dysfunction of lumbar region: Secondary | ICD-10-CM | POA: Diagnosis not present

## 2015-11-25 DIAGNOSIS — M9913 Subluxation complex (vertebral) of lumbar region: Secondary | ICD-10-CM | POA: Diagnosis not present

## 2015-12-11 MED FILL — NORETHINDRONE 0.35 MG TAB: 0.35 | 84 days supply | Qty: 84 | Fill #0

## 2016-01-15 ENCOUNTER — Other Ambulatory Visit: Payer: Self-pay | Admitting: Obstetrics and Gynecology

## 2016-01-15 DIAGNOSIS — Z01419 Encounter for gynecological examination (general) (routine) without abnormal findings: Secondary | ICD-10-CM | POA: Diagnosis not present

## 2016-01-15 DIAGNOSIS — Z124 Encounter for screening for malignant neoplasm of cervix: Secondary | ICD-10-CM | POA: Diagnosis not present

## 2016-01-15 DIAGNOSIS — Z6830 Body mass index (BMI) 30.0-30.9, adult: Secondary | ICD-10-CM | POA: Diagnosis not present

## 2016-01-19 LAB — CYTOLOGY - PAP

## 2016-01-21 LAB — CYTOLOGY - PAP

## 2016-03-04 MED FILL — HEATHER TABLET: 0.35 | 84 days supply | Qty: 84 | Fill #0

## 2016-03-05 ENCOUNTER — Ambulatory Visit: Payer: 59 | Admitting: Family Medicine

## 2016-03-17 MED FILL — CEPHALEXIN 500 MG CAPSULE: 500 | 7 days supply | Qty: 28 | Fill #0

## 2016-05-07 ENCOUNTER — Encounter: Payer: Self-pay | Admitting: Behavioral Health

## 2016-05-07 ENCOUNTER — Telehealth: Payer: Self-pay | Admitting: Behavioral Health

## 2016-05-07 NOTE — Telephone Encounter (Signed)
Pre-Visit Call completed with patient and chart updated.   Pre-Visit Info documented in Specialty Comments under [employer].    

## 2016-05-10 ENCOUNTER — Ambulatory Visit (INDEPENDENT_AMBULATORY_CARE_PROVIDER_SITE_OTHER): Payer: 59 | Admitting: Family Medicine

## 2016-05-10 ENCOUNTER — Encounter: Payer: Self-pay | Admitting: Family Medicine

## 2016-05-10 VITALS — BP 98/62 | HR 99 | Temp 98.7°F | Ht 63.5 in | Wt 174.2 lb

## 2016-05-10 DIAGNOSIS — E162 Hypoglycemia, unspecified: Secondary | ICD-10-CM

## 2016-05-10 DIAGNOSIS — I951 Orthostatic hypotension: Secondary | ICD-10-CM | POA: Diagnosis not present

## 2016-05-10 DIAGNOSIS — G90A Postural orthostatic tachycardia syndrome (POTS): Secondary | ICD-10-CM

## 2016-05-10 DIAGNOSIS — R000 Tachycardia, unspecified: Secondary | ICD-10-CM

## 2016-05-10 DIAGNOSIS — Z0000 Encounter for general adult medical examination without abnormal findings: Secondary | ICD-10-CM

## 2016-05-10 DIAGNOSIS — N809 Endometriosis, unspecified: Secondary | ICD-10-CM | POA: Diagnosis not present

## 2016-05-10 MED ORDER — METOPROLOL TARTRATE 25 MG PO TABS: 25.0000 mg | ORAL_TABLET | Freq: Two times a day (BID) | ORAL | 1 refills | Status: DC

## 2016-05-10 MED FILL — METOPROLOL TARTRATE 25 MG T: 25 | 90 days supply | Qty: 180 | Fill #0

## 2016-05-10 NOTE — Progress Notes (Signed)
Pre visit review using our clinic review tool, if applicable. No additional management support is needed unless otherwise documented below in the visit note. 

## 2016-05-10 NOTE — Patient Instructions (Signed)
Preventive Care for Adults, Female A healthy lifestyle and preventive care can promote health and wellness. Preventive health guidelines for women include the following key practices.  A routine yearly physical is a good way to check with your health care provider about your health and preventive screening. It is a chance to share any concerns and updates on your health and to receive a thorough exam.  Visit your dentist for a routine exam and preventive care every 6 months. Brush your teeth twice a day and floss once a day. Good oral hygiene prevents tooth decay and gum disease.  The frequency of eye exams is based on your age, health, family medical history, use of contact lenses, and other factors. Follow your health care provider's recommendations for frequency of eye exams.  Eat a healthy diet. Foods like vegetables, fruits, whole grains, low-fat dairy products, and lean protein foods contain the nutrients you need without too many calories. Decrease your intake of foods high in solid fats, added sugars, and salt. Eat the right amount of calories for you.Get information about a proper diet from your health care provider, if necessary.  Regular physical exercise is one of the most important things you can do for your health. Most adults should get at least 150 minutes of moderate-intensity exercise (any activity that increases your heart rate and causes you to sweat) each week. In addition, most adults need muscle-strengthening exercises on 2 or more days a week.  Maintain a healthy weight. The body mass index (BMI) is a screening tool to identify possible weight problems. It provides an estimate of body fat based on height and weight. Your health care provider can find your BMI and can help you achieve or maintain a healthy weight.For adults 20 years and older:  A BMI below 18.5 is considered underweight.  A BMI of 18.5 to 24.9 is normal.  A BMI of 25 to 29.9 is considered overweight.  A  BMI of 30 and above is considered obese.  Maintain normal blood lipids and cholesterol levels by exercising and minimizing your intake of saturated fat. Eat a balanced diet with plenty of fruit and vegetables. Blood tests for lipids and cholesterol should begin at age 45 and be repeated every 5 years. If your lipid or cholesterol levels are high, you are over 50, or you are at high risk for heart disease, you may need your cholesterol levels checked more frequently.Ongoing high lipid and cholesterol levels should be treated with medicines if diet and exercise are not working.  If you smoke, find out from your health care provider how to quit. If you do not use tobacco, do not start.  Lung cancer screening is recommended for adults aged 45-80 years who are at high risk for developing lung cancer because of a history of smoking. A yearly low-dose CT scan of the lungs is recommended for people who have at least a 30-pack-year history of smoking and are a current smoker or have quit within the past 15 years. A pack year of smoking is smoking an average of 1 pack of cigarettes a day for 1 year (for example: 1 pack a day for 30 years or 2 packs a day for 15 years). Yearly screening should continue until the smoker has stopped smoking for at least 15 years. Yearly screening should be stopped for people who develop a health problem that would prevent them from having lung cancer treatment.  If you are pregnant, do not drink alcohol. If you are  breastfeeding, be very cautious about drinking alcohol. If you are not pregnant and choose to drink alcohol, do not have more than 1 drink per day. One drink is considered to be 12 ounces (355 mL) of beer, 5 ounces (148 mL) of wine, or 1.5 ounces (44 mL) of liquor.  Avoid use of street drugs. Do not share needles with anyone. Ask for help if you need support or instructions about stopping the use of drugs.  High blood pressure causes heart disease and increases the risk  of stroke. Your blood pressure should be checked at least every 1 to 2 years. Ongoing high blood pressure should be treated with medicines if weight loss and exercise do not work.  If you are 55-79 years old, ask your health care provider if you should take aspirin to prevent strokes.  Diabetes screening is done by taking a blood sample to check your blood glucose level after you have not eaten for a certain period of time (fasting). If you are not overweight and you do not have risk factors for diabetes, you should be screened once every 3 years starting at age 45. If you are overweight or obese and you are 40-70 years of age, you should be screened for diabetes every year as part of your cardiovascular risk assessment.  Breast cancer screening is essential preventive care for women. You should practice "breast self-awareness." This means understanding the normal appearance and feel of your breasts and may include breast self-examination. Any changes detected, no matter how small, should be reported to a health care provider. Women in their 20s and 30s should have a clinical breast exam (CBE) by a health care provider as part of a regular health exam every 1 to 3 years. After age 40, women should have a CBE every year. Starting at age 40, women should consider having a mammogram (breast X-ray test) every year. Women who have a family history of breast cancer should talk to their health care provider about genetic screening. Women at a high risk of breast cancer should talk to their health care providers about having an MRI and a mammogram every year.  Breast cancer gene (BRCA)-related cancer risk assessment is recommended for women who have family members with BRCA-related cancers. BRCA-related cancers include breast, ovarian, tubal, and peritoneal cancers. Having family members with these cancers may be associated with an increased risk for harmful changes (mutations) in the breast cancer genes BRCA1 and  BRCA2. Results of the assessment will determine the need for genetic counseling and BRCA1 and BRCA2 testing.  Your health care provider may recommend that you be screened regularly for cancer of the pelvic organs (ovaries, uterus, and vagina). This screening involves a pelvic examination, including checking for microscopic changes to the surface of your cervix (Pap test). You may be encouraged to have this screening done every 3 years, beginning at age 21.  For women ages 30-65, health care providers may recommend pelvic exams and Pap testing every 3 years, or they may recommend the Pap and pelvic exam, combined with testing for human papilloma virus (HPV), every 5 years. Some types of HPV increase your risk of cervical cancer. Testing for HPV may also be done on women of any age with unclear Pap test results.  Other health care providers may not recommend any screening for nonpregnant women who are considered low risk for pelvic cancer and who do not have symptoms. Ask your health care provider if a screening pelvic exam is right for   you.  If you have had past treatment for cervical cancer or a condition that could lead to cancer, you need Pap tests and screening for cancer for at least 20 years after your treatment. If Pap tests have been discontinued, your risk factors (such as having a new sexual partner) need to be reassessed to determine if screening should resume. Some women have medical problems that increase the chance of getting cervical cancer. In these cases, your health care provider may recommend more frequent screening and Pap tests.  Colorectal cancer can be detected and often prevented. Most routine colorectal cancer screening begins at the age of 50 years and continues through age 75 years. However, your health care provider may recommend screening at an earlier age if you have risk factors for colon cancer. On a yearly basis, your health care provider may provide home test kits to check  for hidden blood in the stool. Use of a small camera at the end of a tube, to directly examine the colon (sigmoidoscopy or colonoscopy), can detect the earliest forms of colorectal cancer. Talk to your health care provider about this at age 50, when routine screening begins. Direct exam of the colon should be repeated every 5-10 years through age 75 years, unless early forms of precancerous polyps or small growths are found.  People who are at an increased risk for hepatitis B should be screened for this virus. You are considered at high risk for hepatitis B if:  You were born in a country where hepatitis B occurs often. Talk with your health care provider about which countries are considered high risk.  Your parents were born in a high-risk country and you have not received a shot to protect against hepatitis B (hepatitis B vaccine).  You have HIV or AIDS.  You use needles to inject street drugs.  You live with, or have sex with, someone who has hepatitis B.  You get hemodialysis treatment.  You take certain medicines for conditions like cancer, organ transplantation, and autoimmune conditions.  Hepatitis C blood testing is recommended for all people born from 1945 through 1965 and any individual with known risks for hepatitis C.  Practice safe sex. Use condoms and avoid high-risk sexual practices to reduce the spread of sexually transmitted infections (STIs). STIs include gonorrhea, chlamydia, syphilis, trichomonas, herpes, HPV, and human immunodeficiency virus (HIV). Herpes, HIV, and HPV are viral illnesses that have no cure. They can result in disability, cancer, and death.  You should be screened for sexually transmitted illnesses (STIs) including gonorrhea and chlamydia if:  You are sexually active and are younger than 24 years.  You are older than 24 years and your health care provider tells you that you are at risk for this type of infection.  Your sexual activity has changed  since you were last screened and you are at an increased risk for chlamydia or gonorrhea. Ask your health care provider if you are at risk.  If you are at risk of being infected with HIV, it is recommended that you take a prescription medicine daily to prevent HIV infection. This is called preexposure prophylaxis (PrEP). You are considered at risk if:  You are sexually active and do not regularly use condoms or know the HIV status of your partner(s).  You take drugs by injection.  You are sexually active with a partner who has HIV.  Talk with your health care provider about whether you are at high risk of being infected with HIV. If   you choose to begin PrEP, you should first be tested for HIV. You should then be tested every 3 months for as long as you are taking PrEP.  Osteoporosis is a disease in which the bones lose minerals and strength with aging. This can result in serious bone fractures or breaks. The risk of osteoporosis can be identified using a bone density scan. Women ages 67 years and over and women at risk for fractures or osteoporosis should discuss screening with their health care providers. Ask your health care provider whether you should take a calcium supplement or vitamin D to reduce the rate of osteoporosis.  Menopause can be associated with physical symptoms and risks. Hormone replacement therapy is available to decrease symptoms and risks. You should talk to your health care provider about whether hormone replacement therapy is right for you.  Use sunscreen. Apply sunscreen liberally and repeatedly throughout the day. You should seek shade when your shadow is shorter than you. Protect yourself by wearing long sleeves, pants, a wide-brimmed hat, and sunglasses year round, whenever you are outdoors.  Once a month, do a whole body skin exam, using a mirror to look at the skin on your back. Tell your health care provider of new moles, moles that have irregular borders, moles that  are larger than a pencil eraser, or moles that have changed in shape or color.  Stay current with required vaccines (immunizations).  Influenza vaccine. All adults should be immunized every year.  Tetanus, diphtheria, and acellular pertussis (Td, Tdap) vaccine. Pregnant women should receive 1 dose of Tdap vaccine during each pregnancy. The dose should be obtained regardless of the length of time since the last dose. Immunization is preferred during the 27th-36th week of gestation. An adult who has not previously received Tdap or who does not know her vaccine status should receive 1 dose of Tdap. This initial dose should be followed by tetanus and diphtheria toxoids (Td) booster doses every 10 years. Adults with an unknown or incomplete history of completing a 3-dose immunization series with Td-containing vaccines should begin or complete a primary immunization series including a Tdap dose. Adults should receive a Td booster every 10 years.  Varicella vaccine. An adult without evidence of immunity to varicella should receive 2 doses or a second dose if she has previously received 1 dose. Pregnant females who do not have evidence of immunity should receive the first dose after pregnancy. This first dose should be obtained before leaving the health care facility. The second dose should be obtained 4-8 weeks after the first dose.  Human papillomavirus (HPV) vaccine. Females aged 13-26 years who have not received the vaccine previously should obtain the 3-dose series. The vaccine is not recommended for use in pregnant females. However, pregnancy testing is not needed before receiving a dose. If a female is found to be pregnant after receiving a dose, no treatment is needed. In that case, the remaining doses should be delayed until after the pregnancy. Immunization is recommended for any person with an immunocompromised condition through the age of 61 years if she did not get any or all doses earlier. During the  3-dose series, the second dose should be obtained 4-8 weeks after the first dose. The third dose should be obtained 24 weeks after the first dose and 16 weeks after the second dose.  Zoster vaccine. One dose is recommended for adults aged 30 years or older unless certain conditions are present.  Measles, mumps, and rubella (MMR) vaccine. Adults born  before 1957 generally are considered immune to measles and mumps. Adults born in 1957 or later should have 1 or more doses of MMR vaccine unless there is a contraindication to the vaccine or there is laboratory evidence of immunity to each of the three diseases. A routine second dose of MMR vaccine should be obtained at least 28 days after the first dose for students attending postsecondary schools, health care workers, or international travelers. People who received inactivated measles vaccine or an unknown type of measles vaccine during 1963-1967 should receive 2 doses of MMR vaccine. People who received inactivated mumps vaccine or an unknown type of mumps vaccine before 1979 and are at high risk for mumps infection should consider immunization with 2 doses of MMR vaccine. For females of childbearing age, rubella immunity should be determined. If there is no evidence of immunity, females who are not pregnant should be vaccinated. If there is no evidence of immunity, females who are pregnant should delay immunization until after pregnancy. Unvaccinated health care workers born before 1957 who lack laboratory evidence of measles, mumps, or rubella immunity or laboratory confirmation of disease should consider measles and mumps immunization with 2 doses of MMR vaccine or rubella immunization with 1 dose of MMR vaccine.  Pneumococcal 13-valent conjugate (PCV13) vaccine. When indicated, a person who is uncertain of his immunization history and has no record of immunization should receive the PCV13 vaccine. All adults 65 years of age and older should receive this  vaccine. An adult aged 19 years or older who has certain medical conditions and has not been previously immunized should receive 1 dose of PCV13 vaccine. This PCV13 should be followed with a dose of pneumococcal polysaccharide (PPSV23) vaccine. Adults who are at high risk for pneumococcal disease should obtain the PPSV23 vaccine at least 8 weeks after the dose of PCV13 vaccine. Adults older than 23 years of age who have normal immune system function should obtain the PPSV23 vaccine dose at least 1 year after the dose of PCV13 vaccine.  Pneumococcal polysaccharide (PPSV23) vaccine. When PCV13 is also indicated, PCV13 should be obtained first. All adults aged 65 years and older should be immunized. An adult younger than age 65 years who has certain medical conditions should be immunized. Any person who resides in a nursing home or long-term care facility should be immunized. An adult smoker should be immunized. People with an immunocompromised condition and certain other conditions should receive both PCV13 and PPSV23 vaccines. People with human immunodeficiency virus (HIV) infection should be immunized as soon as possible after diagnosis. Immunization during chemotherapy or radiation therapy should be avoided. Routine use of PPSV23 vaccine is not recommended for American Indians, [place] Natives, or people younger than 65 years unless there are medical conditions that require PPSV23 vaccine. When indicated, people who have unknown immunization and have no record of immunization should receive PPSV23 vaccine. One-time revaccination 5 years after the first dose of PPSV23 is recommended for people aged 19-64 years who have chronic kidney failure, nephrotic syndrome, asplenia, or immunocompromised conditions. People who received 1-2 doses of PPSV23 before age 65 years should receive another dose of PPSV23 vaccine at age 65 years or later if at least 5 years have passed since the previous dose. Doses of PPSV23 are not  needed for people immunized with PPSV23 at or after age 65 years.  Meningococcal vaccine. Adults with asplenia or persistent complement component deficiencies should receive 2 doses of quadrivalent meningococcal conjugate (MenACWY-D) vaccine. The doses should be obtained   at least 2 months apart. Microbiologists working with certain meningococcal bacteria, [place] recruits, people at risk during an outbreak, and people who travel to or live in countries with a high rate of meningitis should be immunized. A first-year college student up through age 34 years who is living in a residence hall should receive a dose if she did not receive a dose on or after her 16th birthday. Adults who have certain high-risk conditions should receive one or more doses of vaccine.  Hepatitis A vaccine. Adults who wish to be protected from this disease, have certain high-risk conditions, work with hepatitis A-infected animals, work in hepatitis A research labs, or travel to or work in countries with a high rate of hepatitis A should be immunized. Adults who were previously unvaccinated and who anticipate close contact with an international adoptee during the first 60 days after arrival in the [place] from a country with a high rate of hepatitis A should be immunized.  Hepatitis B vaccine. Adults who wish to be protected from this disease, have certain high-risk conditions, may be exposed to blood or other infectious body fluids, are household contacts or sex partners of hepatitis B positive people, are clients or workers in certain care facilities, or travel to or work in countries with a high rate of hepatitis B should be immunized.  Haemophilus influenzae type b (Hib) vaccine. A previously unvaccinated person with asplenia or sickle cell disease or having a scheduled splenectomy should receive 1 dose of Hib vaccine. Regardless of previous immunization, a recipient of a hematopoietic stem cell transplant should receive a  3-dose series 6-12 months after her successful transplant. Hib vaccine is not recommended for adults with HIV infection. Preventive Services / Frequency Ages 35 to 4 years  Blood pressure check.** / Every 3-5 years.  Lipid and cholesterol check.** / Every 5 years beginning at age 60.  Clinical breast exam.** / Every 3 years for women in their 71s and 10s.  BRCA-related cancer risk assessment.** / For women who have family members with a BRCA-related cancer (breast, ovarian, tubal, or peritoneal cancers).  Pap test.** / Every 2 years from ages 76 through 26. Every 3 years starting at age 61 through age 76 or 93 with a history of 3 consecutive normal Pap tests.  HPV screening.** / Every 3 years from ages 37 through ages 60 to 51 with a history of 3 consecutive normal Pap tests.  Hepatitis C blood test.** / For any individual with known risks for hepatitis C.  Skin self-exam. / Monthly.  Influenza vaccine. / Every year.  Tetanus, diphtheria, and acellular pertussis (Tdap, Td) vaccine.** / Consult your health care provider. Pregnant women should receive 1 dose of Tdap vaccine during each pregnancy. 1 dose of Td every 10 years.  Varicella vaccine.** / Consult your health care provider. Pregnant females who do not have evidence of immunity should receive the first dose after pregnancy.  HPV vaccine. / 3 doses over 6 months, if 93 and younger. The vaccine is not recommended for use in pregnant females. However, pregnancy testing is not needed before receiving a dose.  Measles, mumps, rubella (MMR) vaccine.** / You need at least 1 dose of MMR if you were born in 1957 or later. You may also need a 2nd dose. For females of childbearing age, rubella immunity should be determined. If there is no evidence of immunity, females who are not pregnant should be vaccinated. If there is no evidence of immunity, females who are  pregnant should delay immunization until after pregnancy.  Pneumococcal  13-valent conjugate (PCV13) vaccine.** / Consult your health care provider.  Pneumococcal polysaccharide (PPSV23) vaccine.** / 1 to 2 doses if you smoke cigarettes or if you have certain conditions.  Meningococcal vaccine.** / 1 dose if you are age 68 to 8 years and a Market researcher living in a residence hall, or have one of several medical conditions, you need to get vaccinated against meningococcal disease. You may also need additional booster doses.  Hepatitis A vaccine.** / Consult your health care provider.  Hepatitis B vaccine.** / Consult your health care provider.  Haemophilus influenzae type b (Hib) vaccine.** / Consult your health care provider. Ages 7 to 53 years  Blood pressure check.** / Every year.  Lipid and cholesterol check.** / Every 5 years beginning at age 25 years.  Lung cancer screening. / Every year if you are aged 11-80 years and have a 30-pack-year history of smoking and currently smoke or have quit within the past 15 years. Yearly screening is stopped once you have quit smoking for at least 15 years or develop a health problem that would prevent you from having lung cancer treatment.  Clinical breast exam.** / Every year after age 48 years.  BRCA-related cancer risk assessment.** / For women who have family members with a BRCA-related cancer (breast, ovarian, tubal, or peritoneal cancers).  Mammogram.** / Every year beginning at age 41 years and continuing for as long as you are in good health. Consult with your health care provider.  Pap test.** / Every 3 years starting at age 65 years through age 37 or 70 years with a history of 3 consecutive normal Pap tests.  HPV screening.** / Every 3 years from ages 72 years through ages 60 to 40 years with a history of 3 consecutive normal Pap tests.  Fecal occult blood test (FOBT) of stool. / Every year beginning at age 21 years and continuing until age 5 years. You may not need to do this test if you get  a colonoscopy every 10 years.  Flexible sigmoidoscopy or colonoscopy.** / Every 5 years for a flexible sigmoidoscopy or every 10 years for a colonoscopy beginning at age 35 years and continuing until age 48 years.  Hepatitis C blood test.** / For all people born from [birth year] through 1965 and any individual with known risks for hepatitis C.  Skin self-exam. / Monthly.  Influenza vaccine. / Every year.  Tetanus, diphtheria, and acellular pertussis (Tdap/Td) vaccine.** / Consult your health care provider. Pregnant women should receive 1 dose of Tdap vaccine during each pregnancy. 1 dose of Td every 10 years.  Varicella vaccine.** / Consult your health care provider. Pregnant females who do not have evidence of immunity should receive the first dose after pregnancy.  Zoster vaccine.** / 1 dose for adults aged 30 years or older.  Measles, mumps, rubella (MMR) vaccine.** / You need at least 1 dose of MMR if you were born in 1957 or later. You may also need a second dose. For females of childbearing age, rubella immunity should be determined. If there is no evidence of immunity, females who are not pregnant should be vaccinated. If there is no evidence of immunity, females who are pregnant should delay immunization until after pregnancy.  Pneumococcal 13-valent conjugate (PCV13) vaccine.** / Consult your health care provider.  Pneumococcal polysaccharide (PPSV23) vaccine.** / 1 to 2 doses if you smoke cigarettes or if you have certain conditions.  Meningococcal vaccine.** /  Consult your health care provider.  Hepatitis A vaccine.** / Consult your health care provider.  Hepatitis B vaccine.** / Consult your health care provider.  Haemophilus influenzae type b (Hib) vaccine.** / Consult your health care provider. Ages 64 years and over  Blood pressure check.** / Every year.  Lipid and cholesterol check.** / Every 5 years beginning at age 23 years.  Lung cancer screening. / Every year if you  are aged 16-80 years and have a 30-pack-year history of smoking and currently smoke or have quit within the past 15 years. Yearly screening is stopped once you have quit smoking for at least 15 years or develop a health problem that would prevent you from having lung cancer treatment.  Clinical breast exam.** / Every year after age 74 years.  BRCA-related cancer risk assessment.** / For women who have family members with a BRCA-related cancer (breast, ovarian, tubal, or peritoneal cancers).  Mammogram.** / Every year beginning at age 44 years and continuing for as long as you are in good health. Consult with your health care provider.  Pap test.** / Every 3 years starting at age 58 years through age 22 or 39 years with 3 consecutive normal Pap tests. Testing can be stopped between 65 and 70 years with 3 consecutive normal Pap tests and no abnormal Pap or HPV tests in the past 10 years.  HPV screening.** / Every 3 years from ages 64 years through ages 70 or 61 years with a history of 3 consecutive normal Pap tests. Testing can be stopped between 65 and 70 years with 3 consecutive normal Pap tests and no abnormal Pap or HPV tests in the past 10 years.  Fecal occult blood test (FOBT) of stool. / Every year beginning at age 40 years and continuing until age 27 years. You may not need to do this test if you get a colonoscopy every 10 years.  Flexible sigmoidoscopy or colonoscopy.** / Every 5 years for a flexible sigmoidoscopy or every 10 years for a colonoscopy beginning at age 7 years and continuing until age 32 years.  Hepatitis C blood test.** / For all people born from [birth year] through 1963 and any individual with known risks for hepatitis C.  Osteoporosis screening.** / A one-time screening for women ages 30 years and over and women at risk for fractures or osteoporosis.  Skin self-exam. / Monthly.  Influenza vaccine. / Every year.  Tetanus, diphtheria, and acellular pertussis (Tdap/Td)  vaccine.** / 1 dose of Td every 10 years.  Varicella vaccine.** / Consult your health care provider.  Zoster vaccine.** / 1 dose for adults aged 35 years or older.  Pneumococcal 13-valent conjugate (PCV13) vaccine.** / Consult your health care provider.  Pneumococcal polysaccharide (PPSV23) vaccine.** / 1 dose for all adults aged 46 years and older.  Meningococcal vaccine.** / Consult your health care provider.  Hepatitis A vaccine.** / Consult your health care provider.  Hepatitis B vaccine.** / Consult your health care provider.  Haemophilus influenzae type b (Hib) vaccine.** / Consult your health care provider. ** Family history and personal history of risk and conditions may change your health care provider's recommendations.   This information is not intended to replace advice given to you by your health care provider. Make sure you discuss any questions you have with your health care provider.   Document Released: 08/17/2001 Document Revised: 07/12/2014 Document Reviewed: 11/16/2010 Elsevier Interactive Patient Education [employer].

## 2016-05-11 LAB — CBC
HEMATOCRIT: 40.5 % (ref 36.0–46.0)
HEMOGLOBIN: 13.8 g/dL (ref 12.0–15.0)
MCHC: 34 g/dL (ref 30.0–36.0)
MCV: 87.3 fl (ref 78.0–100.0)
PLATELETS: 349 10*3/uL (ref 150.0–400.0)
RBC: 4.64 Mil/uL (ref 3.87–5.11)
RDW: 13.6 % (ref 11.5–15.5)
WBC: 8.1 10*3/uL (ref 4.0–10.5)

## 2016-05-11 LAB — LIPID PANEL
CHOL/HDL RATIO: 3
Cholesterol: 183 mg/dL (ref 0–200)
HDL: 56 mg/dL (ref >39.00)
LDL Cholesterol: 97 mg/dL (ref 0–99)
NONHDL: 127
Triglycerides: 152 mg/dL — ABNORMAL HIGH (ref 0.0–149.0)
VLDL: 30.4 mg/dL (ref 0.0–40.0)

## 2016-05-11 LAB — COMPREHENSIVE METABOLIC PANEL
ALK PHOS: 77 U/L (ref 39–117)
ALT: 28 U/L (ref 0–35)
AST: 17 U/L (ref 0–37)
Albumin: 4.5 g/dL (ref 3.5–5.2)
BILIRUBIN TOTAL: 0.3 mg/dL (ref 0.2–1.2)
BUN: 15 mg/dL (ref 6–23)
CO2: 28 meq/L (ref 19–32)
CREATININE: 0.75 mg/dL (ref 0.40–1.20)
Calcium: 9.7 mg/dL (ref 8.4–10.5)
Chloride: 105 mEq/L (ref 96–112)
GFR: 101.22 mL/min (ref >60.00)
GLUCOSE: 78 mg/dL (ref 70–99)
Potassium: 4.3 mEq/L (ref 3.5–5.1)
Sodium: 141 mEq/L (ref 135–145)
TOTAL PROTEIN: 7.2 g/dL (ref 6.0–8.3)

## 2016-05-11 LAB — TSH: TSH: 1.51 u[IU]/mL (ref 0.35–4.50)

## 2016-05-11 LAB — HEMOGLOBIN A1C: Hgb A1c MFr Bld: 5.1 % (ref 4.6–6.5)

## 2016-05-16 DIAGNOSIS — Z0000 Encounter for general adult medical examination without abnormal findings: Secondary | ICD-10-CM | POA: Insufficient documentation

## 2016-05-16 NOTE — Progress Notes (Signed)
Patient ID: Jean Phillips Schnelle, female   DOB: [birth year], 25 y.o.   MRN: [id number]   Subjective:    Patient ID: Jean Phillips Kalt, female    DOB: [birth year], 25 y.o.   MRN: [id number]  Chief Complaint  Patient presents with  . Establish Care    HPI Patient is in today for new patient appointment and preventative exam. No recent illness or hospitalizations. She manages the diagnosis of POTS and is not taking her beta blocker regularly but feels well. Denies CP/palp/SOB/HA/congestion/fevers/GI or GU c/o. Taking meds as prescribed  Past Medical History:  Diagnosis Date  . Anemia   . Endometriosis   . Endometriosis   . Orthostatic hypotension   . POTS (postural orthostatic tachycardia syndrome)   . Subchorionic hematoma     Past Surgical History:  Procedure Laterality Date  . ENDOMETRIAL ABLATION    . LAPAROSCOPIC OVARIAN CYSTECTOMY      Family History  Problem Relation Age of Onset  . Diabetes Maternal Grandmother   . Thyroid disease Maternal Grandmother   . Hypertension Maternal Grandmother   . Diabetes Maternal Grandfather   . Hypertension Maternal Grandfather   . Hypertension Father   . Heart disease Father     s/p MI in 2014  . Alcohol abuse Father   . Dementia Father   . Diabetes Mother   . Obesity Mother     s/p gastric bypass  . Hyperthyroidism Sister     Social History   Social History  . Marital status: Married    Spouse name: N/A  . Number of children: N/A  . Years of education: N/A   Occupational History  . Not on file.   Social History Main Topics  . Smoking status: Former Games developermoker  . Smokeless tobacco: Not on file  . Alcohol use No  . Drug use: No  . Sexual activity: Yes   Other Topics Concern  . Not on file   Social History Narrative   Lives with husband, son and in laws   Works with [employer], RN 3S   No dietary restrictions.       Outpatient Medications Prior to Visit  Medication Sig Dispense Refill  . Prenatal Vit-Fe Fumarate-FA (PRENATAL  PO) Take by mouth.    . labetalol (NORMODYNE) 100 MG tablet Take 0.5 tablets (50 mg total) by mouth 2 (two) times daily. (Patient not taking: Reported on 05/10/2016) 90 tablet 3   No facility-administered medications prior to visit.     No Known Allergies  Review of Systems  Constitutional: Negative for chills, fever and malaise/fatigue.  HENT: Negative for congestion and hearing loss.   Eyes: Negative for discharge.  Respiratory: Negative for cough, sputum production and shortness of breath.   Cardiovascular: Positive for palpitations. Negative for chest pain and leg swelling.  Gastrointestinal: Negative for abdominal pain, blood in stool, constipation, diarrhea, heartburn, nausea and vomiting.  Genitourinary: Negative for dysuria, frequency, hematuria and urgency.  Musculoskeletal: Negative for back pain, falls and myalgias.  Skin: Negative for rash.  Neurological: Negative for dizziness, sensory change, loss of consciousness, weakness and headaches.  Endo/Heme/Allergies: Negative for environmental allergies. Does not bruise/bleed easily.  Psychiatric/Behavioral: Negative for depression and suicidal ideas. The patient is not nervous/anxious and does not have insomnia.        Objective:    Physical Exam  Constitutional: She is oriented to person, place, and time. She appears well-developed and well-nourished. No distress.  HENT:  Head: Normocephalic and atraumatic.  Eyes: Conjunctivae are normal.  Neck: Neck supple. No thyromegaly present.  Cardiovascular: Normal rate, regular rhythm and normal heart sounds.   No murmur heard. Pulmonary/Chest: Effort normal and breath sounds normal. No respiratory distress.  Abdominal: Soft. Bowel sounds are normal. She exhibits no distension and no mass. There is no tenderness.  Musculoskeletal: She exhibits no edema.  Lymphadenopathy:    She has no cervical adenopathy.  Neurological: She is alert and oriented to person, place, and time.    Skin: Skin is warm and dry.  Psychiatric: She has a normal mood and affect. Her behavior is normal.    BP 98/62 (BP Location: Left Arm, Patient Position: Sitting, Cuff Size: Normal)   Pulse 99   Temp 98.7 F (37.1 C) (Oral)   Ht 5' 3.5" (1.613 m)   Wt 174 lb 4 oz (79 kg)   SpO2 98%   BMI 30.38 kg/m  Wt Readings from Last 3 Encounters:  05/10/16 174 lb 4 oz (79 kg)  06/19/15 218 lb 6.4 oz (99.1 kg)  05/15/15 205 lb (93 kg)     Lab Results  Component Value Date   WBC 8.1 05/10/2016   HGB 13.8 05/10/2016   HCT 40.5 05/10/2016   PLT 349.0 05/10/2016   GLUCOSE 78 05/10/2016   CHOL 183 05/10/2016   TRIG 152.0 (H) 05/10/2016   HDL 56.00 05/10/2016   LDLCALC 97 05/10/2016   ALT 28 05/10/2016   AST 17 05/10/2016   NA 141 05/10/2016   K 4.3 05/10/2016   CL 105 05/10/2016   CREATININE 0.75 05/10/2016   BUN 15 05/10/2016   CO2 28 05/10/2016   TSH 1.51 05/10/2016   HGBA1C 5.1 05/10/2016    Lab Results  Component Value Date   TSH 1.51 05/10/2016   Lab Results  Component Value Date   WBC 8.1 05/10/2016   HGB 13.8 05/10/2016   HCT 40.5 05/10/2016   MCV 87.3 05/10/2016   PLT 349.0 05/10/2016   Lab Results  Component Value Date   NA 141 05/10/2016   K 4.3 05/10/2016   CO2 28 05/10/2016   GLUCOSE 78 05/10/2016   BUN 15 05/10/2016   CREATININE 0.75 05/10/2016   BILITOT 0.3 05/10/2016   ALKPHOS 77 05/10/2016   AST 17 05/10/2016   ALT 28 05/10/2016   PROT 7.2 05/10/2016   ALBUMIN 4.5 05/10/2016   CALCIUM 9.7 05/10/2016   GFR 101.22 05/10/2016   Lab Results  Component Value Date   CHOL 183 05/10/2016   Lab Results  Component Value Date   HDL 56.00 05/10/2016   Lab Results  Component Value Date   LDLCALC 97 05/10/2016   Lab Results  Component Value Date   TRIG 152.0 (H) 05/10/2016   Lab Results  Component Value Date   CHOLHDL 3 05/10/2016   Lab Results  Component Value Date   HGBA1C 5.1 05/10/2016       Assessment & Plan:   Problem List  Items Addressed This Visit    POTS (postural orthostatic tachycardia syndrome)    Maintain adequate hydration and sodium. Report worsening symptoms. Metoprolol      Relevant Medications   metoprolol tartrate (LOPRESSOR) 25 MG tablet   Other Relevant Orders   Lipid panel (Completed)   Comprehensive metabolic panel (Completed)   TSH (Completed)   CBC (Completed)   Endometriosis   Hypoglycemia   Relevant Orders   Hemoglobin A1c (Completed)   Preventative health care - Primary    Patient encouraged to maintain heart healthy diet, regular  exercise, adequate sleep. Consider daily probiotics. Take medications as prescribed      Relevant Orders   Lipid panel (Completed)   Comprehensive metabolic panel (Completed)   TSH (Completed)   CBC (Completed)      I have discontinued Ms. Fraticelli's labetalol. I am also having her start on metoprolol tartrate. Additionally, I am having her maintain her Prenatal Vit-Fe Fumarate-FA (PRENATAL PO).  Meds ordered this encounter  Medications  . metoprolol tartrate (LOPRESSOR) 25 MG tablet    Sig: Take 1 tablet (25 mg total) by mouth 2 (two) times daily.    Dispense:  180 tablet    Refill:  1     Danise EdgeBLYTH, STACEY, MD

## 2016-05-16 NOTE — Assessment & Plan Note (Addendum)
Maintain adequate hydration and sodium. Report worsening symptoms. Metoprolol

## 2016-05-16 NOTE — Assessment & Plan Note (Signed)
Patient encouraged to maintain heart healthy diet, regular exercise, adequate sleep. Consider daily probiotics. Take medications as prescribed 

## 2016-05-31 MED FILL — HEATHER TABLET: 0.35 | 84 days supply | Qty: 84 | Fill #1

## 2016-06-18 ENCOUNTER — Encounter: Payer: Self-pay | Admitting: Cardiovascular Disease

## 2016-06-18 ENCOUNTER — Ambulatory Visit (INDEPENDENT_AMBULATORY_CARE_PROVIDER_SITE_OTHER): Payer: 59 | Admitting: Cardiovascular Disease

## 2016-06-18 VITALS — BP 90/70 | HR 78 | Ht 63.5 in | Wt 173.8 lb

## 2016-06-18 DIAGNOSIS — R000 Tachycardia, unspecified: Secondary | ICD-10-CM | POA: Insufficient documentation

## 2016-06-18 MED ORDER — METOPROLOL TARTRATE 25 MG PO TABS: 25.0000 mg | ORAL_TABLET | Freq: Two times a day (BID) | ORAL | 3 refills | Status: DC

## 2016-06-18 NOTE — Progress Notes (Signed)
Cardiology Office Note   Date:  06/18/2016   ID:  Jean Phillips, DOB Feb 01, 1991, MRN [id number]  PCP:  Danise EdgeBLYTH, STACEY, MD  Cardiologist:   Kristeen MissPhilip Nahser, MD   No chief complaint on file.  Problem list 1. Sinus tachycardia-with pregnancy 2. POTS  - was well controlled on metoprolol .     05/15/15  Jean Phillips is a 25 y.o. female who presents for sinus tachycardia .  She is 8 months pregnant.  Has a hx of POTS syndrome.  Took metoprolol in the past with success.  Stopped   It July 2015 due to lack of insurance.   Otherwise doing great.  Some fatigue .   Was diagnosed with POTS in Dec. 2014.     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 15, 2017:  Jean Phillips is doing well. We started her on labetalol for episodes of tachycardia. She was 8 months pregnant at the time. After delivery she was changed to metoprolol. She has been tolerating that fairly well.  Needs to exercise more.   No CP or dyspnea.   No syncope    Has been seeing Reuel DerbyStacy Blyth   Past Medical History:  Diagnosis Date  . Anemia   . Endometriosis   . Endometriosis   . Orthostatic hypotension   . POTS (postural orthostatic tachycardia syndrome)   . Subchorionic hematoma     Past Surgical History:  Procedure Laterality Date  . ENDOMETRIAL ABLATION    . LAPAROSCOPIC OVARIAN CYSTECTOMY       Current Outpatient Prescriptions  Medication Sig Dispense Refill  . HEATHER 0.35 MG tablet Take 1 tablet by mouth daily.  12  . metoprolol tartrate (LOPRESSOR) 25 MG tablet Take 1 tablet (25 mg total) by mouth 2 (two) times daily. 180 tablet 1  . Prenatal Vit-Fe Fumarate-FA (PRENATAL PO) Take by mouth.     No current facility-administered medications for this visit.     Allergies:   Patient has no known allergies.    Social History:  The patient  reports that she has quit smoking. She does not have any smokeless tobacco history on file. She reports  that she does not drink alcohol or use drugs.   Family History:  The patient's family history includes Alcohol abuse in her father; Dementia in her father; Diabetes in her maternal grandfather, maternal grandmother, and mother; Heart disease in her father; Hypertension in her father, maternal grandfather, and maternal grandmother; Hyperthyroidism in her sister; Obesity in her mother; Thyroid disease in her maternal grandmother.    ROS:  Please see the history of present illness.    Review of Systems: Constitutional:  denies fever, chills, diaphoresis, appetite change and fatigue.  HEENT: denies photophobia, eye pain, redness, hearing loss, ear pain, congestion, sore throat, rhinorrhea, sneezing, neck pain, neck stiffness and tinnitus.  Respiratory: denies SOB, DOE, cough, chest tightness, and wheezing.  Cardiovascular: denies chest pain, palpitations and leg swelling.  Gastrointestinal: denies nausea, vomiting, abdominal pain, diarrhea, constipation, blood in stool.  Genitourinary: denies dysuria, urgency, frequency, hematuria, flank pain and difficulty urinating.  Musculoskeletal: denies  myalgias, back pain, joint swelling, arthralgias and gait problem.   Skin: denies pallor, rash and wound.  Neurological: denies dizziness, seizures, syncope, weakness, light-headedness, numbness and headaches.   Hematological: denies adenopathy, easy bruising, personal or family bleeding history.  Psychiatric/ Behavioral: denies suicidal ideation, mood changes,  confusion, nervousness, sleep disturbance and agitation.       All other systems are reviewed and negative.    PHYSICAL EXAM: VS:  BP 90/70 (BP Location: Left Arm, Patient Position: Sitting, Cuff Size: Normal)   Pulse 78   Ht 5' 3.5" (1.613 m)   Wt 173 lb 12.8 oz (78.8 kg)   BMI 30.30 kg/m  , BMI Body mass index is 30.3 kg/m. GEN: Well nourished, well developed, in no acute distress  HEENT: normal  Neck: no JVD, carotid bruits, or  masses Cardiac: RRR;  Tachycardic,  no murmurs, rubs, or gallops,no edema  Respiratory:  clear to auscultation bilaterally, normal work of breathing GI: soft, nontender, nondistended, + BS MS: no deformity or atrophy  Skin: warm and dry, no rash Neuro:  Strength and sensation are intact Psych: normal   EKG:  EKG is ordered today. The ekg ordered today demonstrates sinus  Rhythm at 78.   Sinus arrhythmia. Normal    Recent Labs: 05/10/2016: ALT 28; BUN 15; Creatinine, Ser 0.75; Hemoglobin 13.8; Platelets 349.0; Potassium 4.3; Sodium 141; TSH 1.51    Lipid Panel    Component Value Date/Time   CHOL 183 05/10/2016 1453   TRIG 152.0 (H) 05/10/2016 1453   HDL 56.00 05/10/2016 1453   CHOLHDL 3 05/10/2016 1453   VLDL 30.4 05/10/2016 1453   LDLCALC 97 05/10/2016 1453      Wt Readings from Last 3 Encounters:  06/18/16 173 lb 12.8 oz (78.8 kg)  05/10/16 174 lb 4 oz (79 kg)  06/19/15 218 lb 6.4 oz (99.1 kg)      Other studies Reviewed: Additional studies/ records that were reviewed today include: . Review of the above records demonstrates:    ASSESSMENT AND PLAN:  1.  Sinus tachycardia:  She is doing well- on low dose Metoprolol  will have Dr. Abner GreenspanBlyth continue to see her I will see her as needed.  She will need to be changed to [facility]ebatolol when she is pregnant again .    Current medicines are reviewed at length with the patient today.  The patient does not have concerns regarding medicines.  The following changes have been made:  no change  Labs/ tests ordered today include:  No orders of the defined types were placed in this encounter.    Disposition:   FU with me as needed.     Kristeen MissPhilip Nahser, MD  06/18/2016 10:38 AM    [facility] [address], [place], [place]  [address] Phone: [phone]; Fax: [phone]   [facility]  [address] [place], [place]  [address] [phone]   Fax [phone]

## 2016-06-18 NOTE — Patient Instructions (Signed)
Medication Instructions:  Your physician recommends that you continue on your current medications as directed. Please refer to the Current Medication list given to you today.   Labwork: None Ordered   Testing/Procedures: None Ordered   Follow-Up: Your physician recommends that you schedule a follow-up appointment in: as needed with Dr. Nahser   If you need a refill on your cardiac medications before your next appointment, please call your pharmacy.   Thank you for choosing [facility]! Brendolyn Stockley, RN [phone]    

## 2016-07-01 MED FILL — PREVIDENT 5000 BOOSTER PLUS: 1.1 | 30 days supply | Qty: 100 | Fill #0

## 2016-08-04 MED FILL — METOPROLOL TARTRATE 25 MG T: 25 | 90 days supply | Qty: 180 | Fill #1

## 2016-08-19 MED FILL — HEATHER TABLET: 0.35 | 84 days supply | Qty: 84 | Fill #2

## 2016-10-20 MED FILL — CYCLOBENZAPRINE 10 MG TAB: 10 | 17 days supply | Qty: 50 | Fill #0

## 2016-11-12 MED FILL — NORETHINDRONE 0.35 MG TAB: 0.35 | 84 days supply | Qty: 84 | Fill #3

## 2016-11-23 MED FILL — LARIN 21 1-20 TABLET: 1-20 | 21 days supply | Qty: 21 | Fill #0

## 2016-12-10 MED FILL — METOPROLOL TARTRATE 25 MG T: 25 | 90 days supply | Qty: 180 | Fill #0

## 2016-12-10 MED FILL — LARIN 21 1-20 TABLET: 1-20 | 21 days supply | Qty: 21 | Fill #1

## 2017-01-03 MED FILL — LARIN 21 1-20 TABLET: 1-20 | 21 days supply | Qty: 21 | Fill #2

## 2017-01-26 DIAGNOSIS — Z01419 Encounter for gynecological examination (general) (routine) without abnormal findings: Secondary | ICD-10-CM | POA: Diagnosis not present

## 2017-01-26 DIAGNOSIS — Z6830 Body mass index (BMI) 30.0-30.9, adult: Secondary | ICD-10-CM | POA: Diagnosis not present

## 2017-01-26 DIAGNOSIS — Z3202 Encounter for pregnancy test, result negative: Secondary | ICD-10-CM | POA: Diagnosis not present

## 2017-01-26 MED FILL — LARIN 21 1-20 TABLET: 1-20 | 84 days supply | Qty: 84 | Fill #0

## 2017-02-02 DIAGNOSIS — Z0100 Encounter for examination of eyes and vision without abnormal findings: Secondary | ICD-10-CM | POA: Diagnosis not present

## 2017-03-07 ENCOUNTER — Telehealth: Payer: 59 | Admitting: Family

## 2017-03-07 DIAGNOSIS — R399 Unspecified symptoms and signs involving the genitourinary system: Secondary | ICD-10-CM | POA: Diagnosis not present

## 2017-03-07 MED ORDER — NITROFURANTOIN MONOHYD MACRO 100 MG PO CAPS: 100.0000 mg | ORAL_CAPSULE | Freq: Two times a day (BID) | ORAL | 0 refills | Status: DC

## 2017-03-07 NOTE — Progress Notes (Signed)

## 2017-03-08 MED FILL — NITROFURANTOIN MONO-MCR 100: 100 | 5 days supply | Qty: 10 | Fill #0

## 2017-04-29 MED FILL — PREVIDENT 5000 BOOSTER PLUS: 1.1 | 30 days supply | Qty: 100 | Fill #1

## 2017-04-29 MED FILL — LARIN 21 1-20 TABLET: 1-20 | 84 days supply | Qty: 84 | Fill #1

## 2017-05-12 ENCOUNTER — Encounter: Payer: 59 | Admitting: Family Medicine

## 2017-07-08 ENCOUNTER — Encounter: Payer: Self-pay | Admitting: Family Medicine

## 2017-07-08 ENCOUNTER — Ambulatory Visit (INDEPENDENT_AMBULATORY_CARE_PROVIDER_SITE_OTHER): Payer: 59 | Admitting: Family Medicine

## 2017-07-08 VITALS — BP 126/72 | HR 84 | Temp 98.5°F | Resp 16 | Ht 64.0 in | Wt 180.6 lb

## 2017-07-08 DIAGNOSIS — G90A Postural orthostatic tachycardia syndrome (POTS): Secondary | ICD-10-CM

## 2017-07-08 DIAGNOSIS — G43909 Migraine, unspecified, not intractable, without status migrainosus: Secondary | ICD-10-CM | POA: Diagnosis not present

## 2017-07-08 DIAGNOSIS — R000 Tachycardia, unspecified: Secondary | ICD-10-CM

## 2017-07-08 DIAGNOSIS — Z0000 Encounter for general adult medical examination without abnormal findings: Secondary | ICD-10-CM

## 2017-07-08 DIAGNOSIS — I951 Orthostatic hypotension: Secondary | ICD-10-CM

## 2017-07-08 DIAGNOSIS — Z0001 Encounter for general adult medical examination with abnormal findings: Secondary | ICD-10-CM

## 2017-07-08 LAB — COMPREHENSIVE METABOLIC PANEL
ALBUMIN: 3.9 g/dL (ref 3.5–5.2)
ALT: 10 U/L (ref 0–35)
AST: 11 U/L (ref 0–37)
Alkaline Phosphatase: 50 U/L (ref 39–117)
BUN: 10 mg/dL (ref 6–23)
CALCIUM: 8.7 mg/dL (ref 8.4–10.5)
CHLORIDE: 105 meq/L (ref 96–112)
CO2: 28 mEq/L (ref 19–32)
Creatinine, Ser: 0.79 mg/dL (ref 0.40–1.20)
GFR: 94.41 mL/min (ref >60.00)
Glucose, Bld: 87 mg/dL (ref 70–99)
POTASSIUM: 4.1 meq/L (ref 3.5–5.1)
Sodium: 139 mEq/L (ref 135–145)
Total Bilirubin: 0.3 mg/dL (ref 0.2–1.2)
Total Protein: 6.5 g/dL (ref 6.0–8.3)

## 2017-07-08 LAB — CBC
HEMATOCRIT: 41.9 % (ref 36.0–46.0)
HEMOGLOBIN: 13.7 g/dL (ref 12.0–15.0)
MCHC: 32.6 g/dL (ref 30.0–36.0)
MCV: 88.7 fl (ref 78.0–100.0)
Platelets: 327 10*3/uL (ref 150.0–400.0)
RBC: 4.73 Mil/uL (ref 3.87–5.11)
RDW: 13.6 % (ref 11.5–15.5)
WBC: 5.7 10*3/uL (ref 4.0–10.5)

## 2017-07-08 LAB — TSH: TSH: 2.83 u[IU]/mL (ref 0.35–4.50)

## 2017-07-08 MED ORDER — PROMETHAZINE HCL 25 MG PO TABS: 12.5000 mg | ORAL_TABLET | Freq: Four times a day (QID) | ORAL | 1 refills | Status: DC | PRN

## 2017-07-08 MED ORDER — PROPRANOLOL HCL 10 MG PO TABS: 10.0000 mg | ORAL_TABLET | Freq: Two times a day (BID) | ORAL | 2 refills | Status: DC

## 2017-07-08 MED ORDER — SUMATRIPTAN SUCCINATE 50 MG PO TABS: 50.0000 mg | ORAL_TABLET | ORAL | 1 refills | Status: DC | PRN

## 2017-07-08 MED FILL — PROPRANOLOL HCL 10 MG TAB: 10 | 30 days supply | Qty: 60 | Fill #0

## 2017-07-08 MED FILL — SUMATRIPTAN SUCC 50 MG TAB: 50 | 30 days supply | Qty: 8 | Fill #0

## 2017-07-08 MED FILL — PROMETHAZINE 25 MG TABLET: 25 | 5 days supply | Qty: 20 | Fill #0

## 2017-07-08 NOTE — Assessment & Plan Note (Signed)
Patient encouraged to maintain heart healthy diet, regular exercise, adequate sleep. Consider daily probiotics. Take medications as prescribed 

## 2017-07-08 NOTE — Assessment & Plan Note (Signed)
No recent episodes not on meds.

## 2017-07-08 NOTE — Assessment & Plan Note (Addendum)
Encouraged increased hydration, 64 ounces of clear fluids daily. Minimize alcohol and caffeine. Eat small frequent meals with lean proteins and complex carbs. Avoid high and low blood sugars. Get adequate sleep, 7-8 hours a night. Needs exercise daily preferably in the morning. 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 Does not resolve the whole day until sleeps at night. Will start Propranolol 10 mg bid and given Imitrex 50 mg and Phenergan 25 mg prn

## 2017-07-08 NOTE — Patient Instructions (Signed)
Preventive Care 18-39 Years, Female Preventive care refers to lifestyle choices and visits with your health care provider that can promote health and wellness. What does preventive care include?  A yearly physical exam. This is also called an annual well check.  Dental exams once or twice a year.  Routine eye exams. Ask your health care provider how often you should have your eyes checked.  Personal lifestyle choices, including: ? Daily care of your teeth and gums. ? Regular physical activity. ? Eating a healthy diet. ? Avoiding tobacco and drug use. ? Limiting alcohol use. ? Practicing safe sex. ? Taking vitamin and mineral supplements as recommended by your health care provider. What happens during an annual well check? The services and screenings done by your health care provider during your annual well check will depend on your age, overall health, lifestyle risk factors, and family history of disease. Counseling Your health care provider may ask you questions about your:  Alcohol use.  Tobacco use.  Drug use.  Emotional well-being.  Home and relationship well-being.  Sexual activity.  Eating habits.  Work and work Statistician.  Method of birth control.  Menstrual cycle.  Pregnancy history.  Screening You may have the following tests or measurements:  Height, weight, and BMI.  Diabetes screening. This is done by checking your blood sugar (glucose) after you have not eaten for a while (fasting).  Blood pressure.  Lipid and cholesterol levels. These may be checked every 5 years starting at age 66.  Skin check.  Hepatitis C blood test.  Hepatitis B blood test.  Sexually transmitted disease (STD) testing.  BRCA-related cancer screening. This may be done if you have a family history of breast, ovarian, tubal, or peritoneal cancers.  Pelvic exam and Pap test. This may be done every 3 years starting at age 40. Starting at age 59, this may be done every 5  years if you have a Pap test in combination with an HPV test.  Discuss your test results, treatment options, and if necessary, the need for more tests with your health care provider. Vaccines Your health care provider may recommend certain vaccines, such as:  Influenza vaccine. This is recommended every year.  Tetanus, diphtheria, and acellular pertussis (Tdap, Td) vaccine. You may need a Td booster every 10 years.  Varicella vaccine. You may need this if you have not been vaccinated.  HPV vaccine. If you are 69 or younger, you may need three doses over 6 months.  Measles, mumps, and rubella (MMR) vaccine. You may need at least one dose of MMR. You may also need a second dose.  Pneumococcal 13-valent conjugate (PCV13) vaccine. You may need this if you have certain conditions and were not previously vaccinated.  Pneumococcal polysaccharide (PPSV23) vaccine. You may need one or two doses if you smoke cigarettes or if you have certain conditions.  Meningococcal vaccine. One dose is recommended if you are age 27-21 years and a first-year college student living in a residence hall, or if you have one of several medical conditions. You may also need additional booster doses.  Hepatitis A vaccine. You may need this if you have certain conditions or if you travel or work in places where you may be exposed to hepatitis A.  Hepatitis B vaccine. You may need this if you have certain conditions or if you travel or work in places where you may be exposed to hepatitis B.  Haemophilus influenzae type b (Hib) vaccine. You may need this if  you have certain risk factors.  Talk to your health care provider about which screenings and vaccines you need and how often you need them. This information is not intended to replace advice given to you by your health care provider. Make sure you discuss any questions you have with your health care provider. Document Released: 08/17/2001 Document Revised: 03/10/2016  Document Reviewed: 04/22/2015 Elsevier Interactive Patient Education  [employer].

## 2017-07-08 NOTE — Progress Notes (Signed)
Subjective:  I acted as a Neurosurgeon for [employer] Fuller Song, RMA   Patient ID: Jean Phillips, female    DOB: 1991-04-20, 27 y.o.   MRN: [id number]  Chief Complaint  Patient presents with  . Annual Exam    HPI  Patient is in today for annual exam and is most concerned about worsening headaches she had migraines as a teen but had been doing well as a young adult til about 6 months ago. 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 She has bd headaches 3-4 x a week usually on work days when she works 12 hour shifts and does not hydrate well or eat proteins regularly. She develps nausea, photophobia, phonophobia and it does not resolve until she is able to go to bet that night. She has tried numerous OTC meds including Ibuprofen and Excedrin. She acknowledges her 25 year old does not sleep well. No other recent febrile illness or recent hospitalizations. She does well with her ADLs. Denies CP/palp/SOB/congestion/fevers/GI or GU c/o. Taking meds as prescribed  Patient Care Team: Bradd Canary, MD as PCP - General (Family Medicine) Philip Aspen, DO as Consulting Physician (Obstetrics and Gynecology) Nahser, Deloris Ping, MD as Consulting Physician (Cardiology)   Past Medical History:  Diagnosis Date  . Anemia   . Endometriosis   . Endometriosis   . Orthostatic hypotension   . POTS (postural orthostatic tachycardia syndrome)   . Subchorionic hematoma     Past Surgical History:  Procedure Laterality Date  . ENDOMETRIAL ABLATION    . LAPAROSCOPIC OVARIAN CYSTECTOMY      Family History  Problem Relation Age of Onset  . Diabetes Maternal Grandmother   . Thyroid disease Maternal Grandmother   . Hypertension Maternal Grandmother   . Diabetes Maternal Grandfather   . Hypertension Maternal Grandfather   . Hypertension Father   . Heart disease Father        s/p MI in 2014  . Alcohol abuse Father   . Dementia Father   . Diabetes Mother   . Obesity Mother        s/p  gastric bypass  . Hyperthyroidism Sister     Social History   Socioeconomic History  . Marital status: Married    Spouse name: Not on file  . Number of children: Not on file  . Years of education: Not on file  . Highest education level: Not on file  Social Needs  . Financial resource strain: Not on file  . Food insecurity - worry: Not on file  . Food insecurity - inability: Not on file  . Transportation needs - medical: Not on file  . Transportation needs - non-medical: Not on file  Occupational History  . Not on file  Tobacco Use  . Smoking status: Former Games developer  . Smokeless tobacco: Never Used  Substance and Sexual Activity  . Alcohol use: No  . Drug use: No  . Sexual activity: Yes  Other Topics Concern  . 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    Outpatient Medications Prior to Visit  Medication Sig Dispense Refill  . norethindrone-ethinyl estradiol (LARIN 1/20) 1-20 MG-MCG tablet Take 1 tablet by mouth daily. 1 Package 11  . HEATHER 0.35 MG tablet Take 1 tablet by mouth daily.  12  . metoprolol tartrate (LOPRESSOR) 25 MG tablet Take  1 tablet (25 mg total) by mouth 2 (two) times daily. (Patient not taking: Reported on 07/08/2017) 180 tablet 3  . nitrofurantoin, macrocrystal-monohydrate, (MACROBID) 100 MG capsule Take 1 capsule (100 mg total) by mouth 2 (two) times daily. 1 po BId (Patient not taking: Reported on 07/08/2017) 10 capsule 0  . Prenatal Vit-Fe Fumarate-FA (PRENATAL PO) Take by mouth.     No facility-administered medications prior to visit.     No Known Allergies  Review of Systems  Constitutional: Negative for fever and malaise/fatigue.  HENT: Negative for congestion.   Eyes: Negative for blurred vision.  Respiratory: Negative for shortness of breath.   Cardiovascular: Negative for chest pain, palpitations and leg swelling.  Gastrointestinal: Negative for abdominal  pain, blood in stool and nausea.  Genitourinary: Negative for dysuria and frequency.  Musculoskeletal: Negative for falls.  Skin: Negative for rash.  Neurological: Positive for headaches. Negative for dizziness and loss of consciousness.  Endo/Heme/Allergies: Negative for environmental allergies.  Psychiatric/Behavioral: Negative for depression. The patient is not nervous/anxious.        Objective:    Physical Exam  Constitutional: She is oriented to person, place, and time. She appears well-developed and well-nourished. No distress.  HENT:  Head: Normocephalic and atraumatic.  Eyes: Conjunctivae are normal.  Neck: Neck supple. No thyromegaly present.  Cardiovascular: Normal rate, regular rhythm and normal heart sounds.  No murmur heard. Pulmonary/Chest: Effort normal and breath sounds normal. No respiratory distress.  Abdominal: Soft. Bowel sounds are normal. She exhibits no distension and no mass. There is no tenderness.  Musculoskeletal: She exhibits no edema.  Lymphadenopathy:    She has no cervical adenopathy.  Neurological: She is alert and oriented to person, place, and time.  Skin: Skin is warm and dry.  Psychiatric: She has a normal mood and affect. Her behavior is normal.    BP 126/72 (BP Location: Left Arm, Patient Position: Sitting, Cuff Size: Normal)   Pulse 84   Temp 98.5 F (36.9 C) (Oral)   Resp 16   Ht 5\' 4"  (1.626 m)   Wt 180 lb 9.6 oz (81.9 kg)   SpO2 98%   BMI 31.00 kg/m  Wt Readings from Last 3 Encounters:  07/08/17 180 lb 9.6 oz (81.9 kg)  06/18/16 173 lb 12.8 oz (78.8 kg)  05/10/16 174 lb 4 oz (79 kg)   BP Readings from Last 3 Encounters:  07/08/17 126/72  06/18/16 90/70  05/10/16 98/62     Immunization History  Administered Date(s) Administered  . Influenza,inj,Quad PF,6+ Mos 06/20/2015  . Influenza-Unspecified 03/05/2016    Health Maintenance  Topic Date Due  . TETANUS/TDAP  09/24/2011  . PAP SMEAR  01/15/2019  . INFLUENZA VACCINE   Completed  . HIV Screening  Completed    Lab Results  Component Value Date   WBC 8.1 05/10/2016   HGB 13.8 05/10/2016   HCT 40.5 05/10/2016   PLT 349.0 05/10/2016   GLUCOSE 78 05/10/2016   CHOL 183 05/10/2016   TRIG 152.0 (H) 05/10/2016   HDL 56.00 05/10/2016   LDLCALC 97 05/10/2016   ALT 28 05/10/2016   AST 17 05/10/2016   NA 141 05/10/2016   K 4.3 05/10/2016   CL 105 05/10/2016   CREATININE 0.75 05/10/2016   BUN 15 05/10/2016   CO2 28 05/10/2016   TSH 1.51 05/10/2016   HGBA1C 5.1 05/10/2016    Lab Results  Component Value Date   TSH 1.51 05/10/2016   Lab Results  Component Value Date  WBC 8.1 05/10/2016   HGB 13.8 05/10/2016   HCT 40.5 05/10/2016   MCV 87.3 05/10/2016   PLT 349.0 05/10/2016   Lab Results  Component Value Date   NA 141 05/10/2016   K 4.3 05/10/2016   CO2 28 05/10/2016   GLUCOSE 78 05/10/2016   BUN 15 05/10/2016   CREATININE 0.75 05/10/2016   BILITOT 0.3 05/10/2016   ALKPHOS 77 05/10/2016   AST 17 05/10/2016   ALT 28 05/10/2016   PROT 7.2 05/10/2016   ALBUMIN 4.5 05/10/2016   CALCIUM 9.7 05/10/2016   GFR 101.22 05/10/2016   Lab Results  Component Value Date   CHOL 183 05/10/2016   Lab Results  Component Value Date   HDL 56.00 05/10/2016   Lab Results  Component Value Date   LDLCALC 97 05/10/2016   Lab Results  Component Value Date   TRIG 152.0 (H) 05/10/2016   Lab Results  Component Value Date   CHOLHDL 3 05/10/2016   Lab Results  Component Value Date   HGBA1C 5.1 05/10/2016         Assessment & Plan:   Problem List Items Addressed This Visit    POTS (postural orthostatic tachycardia syndrome)    No recent episodes not on meds.       Relevant Medications   propranolol (INDERAL) 10 MG tablet   Preventative health care - Primary    Patient encouraged to maintain heart healthy diet, regular exercise, adequate sleep. Consider daily probiotics. Take medications as prescribed      Relevant Orders   TSH    Comprehensive metabolic panel   CBC   Migraine headache    Encouraged increased hydration, 64 ounces of clear fluids daily. Minimize alcohol and caffeine. Eat small frequent meals with lean proteins and complex carbs. Avoid high and low blood sugars. Get adequate sleep, 7-8 hours a night. Needs exercise daily preferably in the morning. 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 Does not resolve the whole day until sleeps at night. Will start Propranolol 10 mg bid and given Imitrex 50 mg and Phenergan 25 mg prn      Relevant Medications   propranolol (INDERAL) 10 MG tablet   SUMAtriptan (IMITREX) 50 MG tablet   Other Relevant Orders   TSH   CBC      I have discontinued Nygeria Linson's Prenatal Vit-Fe Fumarate-FA (PRENATAL PO), HEATHER, metoprolol tartrate, and nitrofurantoin (macrocrystal-monohydrate). I am also having her start on propranolol, SUMAtriptan, and promethazine. Additionally, I am having her maintain her norethindrone-ethinyl estradiol.  Meds ordered this encounter  Medications  . propranolol (INDERAL) 10 MG tablet    Sig: Take 1 tablet (10 mg total) by mouth 2 (two) times daily.    Dispense:  60 tablet    Refill:  2  . SUMAtriptan (IMITREX) 50 MG tablet    Sig: Take 1 tablet (50 mg total) by mouth every 2 (two) hours as needed for migraine. May repeat in 2 hours if headache persists or recurs.    Dispense:  20 tablet    Refill:  1  . promethazine (PHENERGAN) 25 MG tablet    Sig: Take 0.5-1 tablets (12.5-25 mg total) by mouth every 6 (six) hours as needed for nausea or vomiting.    Dispense:  20 tablet    Refill:  1    CMA served as scribe during this visit. History, Physical and Plan performed by medical provider. Documentation and orders reviewed and attested to.  Misty Stanley  Charlett Blake, MD

## 2017-07-11 ENCOUNTER — Telehealth: Payer: Self-pay | Admitting: *Deleted

## 2017-07-11 NOTE — Telephone Encounter (Signed)
Received Medical records from [facility]; forwarded to provider/SLS 01/07

## 2017-07-19 ENCOUNTER — Telehealth: Payer: Self-pay | Admitting: *Deleted

## 2017-07-19 NOTE — Telephone Encounter (Signed)
Received Medical records from [facility]; forwarded to provider/SLS 01/15

## 2017-07-21 MED FILL — LARIN 21 1-20 TABLET: 1-20 | 84 days supply | Qty: 84 | Fill #2

## 2017-08-18 MED FILL — PROPRANOLOL HCL 10 MG TAB: 10 | 30 days supply | Qty: 60 | Fill #1

## 2017-08-18 MED FILL — SUMATRIPTAN SUCC 50 MG TABL: 50 | 30 days supply | Qty: 8 | Fill #1

## 2017-09-29 MED FILL — PROPRANOLOL 10 MG TABLET: 10 | 30 days supply | Qty: 60 | Fill #2

## 2017-10-05 MED FILL — LARIN 21 1-20 TABLET: 1-20 | 84 days supply | Qty: 84 | Fill #3 | Status: TO

## 2017-10-07 ENCOUNTER — Ambulatory Visit: Payer: 59 | Admitting: Family Medicine

## 2017-10-13 ENCOUNTER — Telehealth: Payer: Self-pay

## 2017-10-13 NOTE — Telephone Encounter (Signed)
D/c Imitrex and send in Maxalt 10 mg tab, 1 tab po daily prn migraine can take a second tab in 2 hours if headache persists. Disp #12

## 2017-10-13 NOTE — Telephone Encounter (Signed)
Copied from CRM [id number]. Topic: General - Other >> Oct 06, 2017 10:59 AM Gerrianne ScalePayne, Angela L wrote: Reason for CRM: patient calling to let Dr Abner GreenspanBlyth know that the propranolol [facility](INDERAL) is working well for her the Phenergan helps a little  but the Imitrex isnt working for her     Please advise

## 2017-10-14 MED ORDER — RIZATRIPTAN BENZOATE 10 MG PO TABS: 10.0000 mg | ORAL_TABLET | ORAL | 0 refills | Status: DC | PRN

## 2017-10-14 MED FILL — RIZATRIPTAN BENZOATE 10 MG: 10 | 30 days supply | Qty: 12 | Fill #0

## 2017-10-14 NOTE — Telephone Encounter (Signed)
Sent rx in. Left patient detailed message about prescription

## 2017-10-17 ENCOUNTER — Telehealth: Payer: 59 | Admitting: Family

## 2017-10-17 DIAGNOSIS — J029 Acute pharyngitis, unspecified: Secondary | ICD-10-CM | POA: Diagnosis not present

## 2017-10-17 MED ORDER — BENZONATATE 100 MG PO CAPS: 100.0000 mg | ORAL_CAPSULE | Freq: Three times a day (TID) | ORAL | 0 refills | Status: DC | PRN

## 2017-10-17 MED ORDER — PREDNISONE 5 MG PO TABS: 5.0000 mg | ORAL_TABLET | ORAL | 0 refills | Status: DC

## 2017-10-17 MED ORDER — ALBUTEROL SULFATE HFA 108 (90 BASE) MCG/ACT IN AERS
2.0000 | INHALATION_SPRAY | Freq: Four times a day (QID) | RESPIRATORY_TRACT | 2 refills | Status: DC | PRN
Start: 2017-10-17 — End: 2018-04-28

## 2017-10-17 MED FILL — BENZONATATE 100 MG CAPS: 100 | 5 days supply | Qty: 30 | Fill #0

## 2017-10-17 MED FILL — VENTOLIN HFA 90 MCG INHALER: 108 (90 BAS | 25 days supply | Qty: 18 | Fill #0

## 2017-10-17 MED FILL — predniSONE 5 MG TABS: 5 | 6 days supply | Qty: 21 | Fill #0

## 2017-10-17 NOTE — Progress Notes (Signed)
Thank you for the details you included in the comment boxes. Those details are very helpful in determining the best course of treatment for you and help [place] to provide the best care.  We are sorry that you are not feeling well.  Here is how we plan to help!  Based on your presentation I believe you most likely have A cough due to a virus.  This is called viral bronchitis and is best treated by rest, plenty of fluids and control of the cough.  You may use Ibuprofen or Tylenol as directed to help your symptoms.     In addition you may use A non-prescription cough medication called Mucinex DM: take 2 tablets every 12 hours. and A prescription cough medication called Tessalon Perles 100mg . You may take 1-2 capsules every 8 hours as needed for your cough.  Sterapred 5 mg dosepak   Additionally, I have sent an albuterol inhaler to help open your airways. Take 2 puffs every 6 hours as needed for shortness of breath.   From your responses in the eVisit questionnaire you describe inflammation in the upper respiratory tract which is causing a significant cough.  This is commonly called Bronchitis and has four common causes:    Allergies  Viral Infections  Acid Reflux  Bacterial Infection Allergies, viruses and acid reflux are treated by controlling symptoms or eliminating the cause. An example might be a cough caused by taking certain blood pressure medications. You stop the cough by changing the medication. Another example might be a cough caused by acid reflux. Controlling the reflux helps control the cough.  USE OF BRONCHODILATOR ("RESCUE") INHALERS: There is a risk from using your bronchodilator too frequently.  The risk is that over-reliance on a medication which only relaxes the muscles surrounding the breathing tubes can reduce the effectiveness of medications prescribed to reduce swelling and congestion of the tubes themselves.  Although you feel brief relief from the bronchodilator inhaler,  your asthma may actually be worsening with the tubes becoming more swollen and filled with mucus.  This can delay other crucial treatments, such as oral steroid medications. If you need to use a bronchodilator inhaler daily, several times per day, you should discuss this with your provider.  There are probably better treatments that could be used to keep your asthma under control.     HOME CARE . Only take medications as instructed by your medical team. . Complete the entire course of an antibiotic. . Drink plenty of fluids and get plenty of rest. . Avoid close contacts especially the very young and the elderly . Cover your mouth if you cough or cough into your sleeve. . Always remember to wash your hands . A steam or ultrasonic humidifier can help congestion.   GET HELP RIGHT AWAY IF: . You develop worsening fever. . You become short of breath . You cough up blood. . Your symptoms persist after you have completed your treatment plan MAKE SURE YOU   Understand these instructions.  Will watch your condition.  Will get help right away if you are not doing well or get worse.  Your e-visit answers were reviewed by a board certified advanced clinical practitioner to complete your personal care plan.  Depending on the condition, your plan could have included both over the counter or prescription medications. If there is a problem please reply  once you have received a response from your provider. Your safety is important to [place].  If you have drug allergies  check your prescription carefully.    You can use MyChart to ask questions about today's visit, request a non-urgent call back, or ask for a work or school excuse for 24 hours related to this e-Visit. If it has been greater than 24 hours you will need to follow up with your provider, or enter a new e-Visit to address those concerns. You will get an e-mail in the next two days asking about your experience.  I hope that your e-visit has been  valuable and will speed your recovery. Thank you for using e-visits.

## 2017-11-08 ENCOUNTER — Other Ambulatory Visit: Payer: Self-pay | Admitting: Family Medicine

## 2017-11-08 MED FILL — PROPRANOLOL 10 MG TABLET: 10 | 30 days supply | Qty: 60 | Fill #0

## 2017-12-06 MED FILL — PROPRANOLOL 10 MG TABLET: 10 | 30 days supply | Qty: 60 | Fill #1

## 2018-01-06 MED FILL — PROPRANOLOL 10 MG TABLET: 10 | 30 days supply | Qty: 60 | Fill #2

## 2018-01-06 MED FILL — NORETHIND-ETH ESTRAD 1-0.02: 1-20 | 84 days supply | Qty: 84 | Fill #0

## 2018-01-14 ENCOUNTER — Ambulatory Visit: Payer: Self-pay | Admitting: Family

## 2018-01-14 ENCOUNTER — Encounter: Payer: Self-pay | Admitting: Family

## 2018-01-14 VITALS — BP 120/70 | HR 112 | Temp 98.3°F | Resp 16 | Wt 163.4 lb

## 2018-01-14 DIAGNOSIS — J029 Acute pharyngitis, unspecified: Secondary | ICD-10-CM

## 2018-01-14 LAB — POCT RAPID STREP A (OFFICE): Rapid Strep A Screen: NEGATIVE

## 2018-01-14 MED ORDER — AZITHROMYCIN 250 MG PO TABS: ORAL_TABLET | ORAL | 0 refills | Status: DC

## 2018-01-14 NOTE — Progress Notes (Signed)
Subjective:     Patient ID: Jean Phillips, female   DOB: [birth year], 27 y.o.   MRN: [id number]  HPI 27 year old female is in today with the worse sore throat she has ever had. She reports having a temp of 101.4 when it first began 3 days ago that she took Ibuprofen for and helped some. Denies and sneezing, coughing, or congestion. 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  HENT: Positive for sore throat.   Neurological: Positive for headaches.  All other systems reviewed and are negative.  Past Medical History:  Diagnosis Date  . Anemia   . Endometriosis   . Endometriosis   . Orthostatic hypotension   . POTS (postural orthostatic tachycardia syndrome)   . Subchorionic hematoma     Social History   Socioeconomic History  . Marital status: Married    Spouse name: Not on file  . Number of children: Not on file  . Years of education: Not on file  . Highest education level: Not on file  Occupational History  . Not on file  Social Needs  . Financial resource strain: Not on file  . Food insecurity:    Worry: Not on file    Inability: Not on file  . Transportation needs:    Medical: Not on file    Non-medical: Not on file  Tobacco Use  . Smoking status: Former Games developermoker  . Smokeless tobacco: Never Used  Substance and Sexual Activity  . Alcohol use: No  . Drug use: No  . Sexual activity: Yes  Lifestyle  . Physical activity:    Days per week: Not on file    Minutes per session: Not on file  . Stress: Not on file  Relationships  . Social connections:    Talks on phone: Not on file    Gets together: Not on file    Attends religious service: Not on file    Active member of club or organization: Not on file    Attends meetings of clubs or organizations: Not on file    Relationship status: Not on file  . Intimate partner violence:    Fear of current or ex partner: Not on file    Emotionally abused: Not on file    Physically  abused: Not on file    Forced sexual activity: Not on file  Other Topics Concern  . Not on file  Social History Narrative   Lives with husband, son and in laws   Works with [employer], RN 3S   No dietary restrictions.    Past Surgical History:  Procedure Laterality Date  . ENDOMETRIAL ABLATION    . LAPAROSCOPIC OVARIAN CYSTECTOMY      Family History  Problem Relation Age of Onset  . Diabetes Maternal Grandmother   . Thyroid disease Maternal Grandmother   . Hypertension Maternal Grandmother   . Diabetes Maternal Grandfather   . Hypertension Maternal Grandfather   . Hypertension Father   . Heart disease Father        s/p MI in 2014  . Alcohol abuse Father   . Dementia Father   . Diabetes Mother   . Obesity Mother        s/p gastric bypass  . Hyperthyroidism Sister     No Known Allergies  Current Outpatient Medications on File Prior to Visit  Medication Sig Dispense Refill  . norethindrone-ethinyl estradiol (LARIN 1/20) 1-20 MG-MCG tablet Take 1 tablet by  mouth daily. 1 Package 11  . propranolol (INDERAL) 10 MG tablet TAKE 1 TABLET BY MOUTH 2 TIMES DAILY. 60 tablet 2  . albuterol (PROVENTIL HFA;VENTOLIN HFA) 108 (90 Base) MCG/ACT inhaler Inhale 2 puffs into the lungs every 6 (six) hours as needed for wheezing or shortness of breath. (Patient not taking: Reported on 01/14/2018) 1 Inhaler 2  . benzonatate (TESSALON PERLES) 100 MG capsule Take 1-2 capsules (100-200 mg total) by mouth every 8 (eight) hours as needed for cough. (Patient not taking: Reported on 01/14/2018) 30 capsule 0  . predniSONE (DELTASONE) 5 MG tablet Take 1 tablet (5 mg total) by mouth as directed. Taper 6,5,4,3,2,1 (Patient not taking: Reported on 01/14/2018) 21 tablet 0  . promethazine (PHENERGAN) 25 MG tablet Take 0.5-1 tablets (12.5-25 mg total) by mouth every 6 (six) hours as needed for nausea or vomiting. (Patient not taking: Reported on 01/14/2018) 20 tablet 1  . rizatriptan (MAXALT) 10 MG tablet Take 1 tablet  (10 mg total) by mouth as needed for migraine. May repeat in 2 hours if needed (Patient not taking: Reported on 01/14/2018) 12 tablet 0   No current facility-administered medications on file prior to visit.     BP 120/70 (BP Location: Right Arm, Patient Position: Sitting, Cuff Size: Normal)   Pulse (!) 112   Temp 98.3 F (36.8 C) (Oral)   Resp 16   Wt 163 lb 6.4 oz (74.1 kg)   SpO2 98%   BMI 28.05 kg/m chart    Objective:   Physical Exam  Constitutional: She is oriented to person, place, and time. She appears well-developed and well-nourished.  HENT:  Right Ear: Tympanic membrane and ear canal normal.  Left Ear: Tympanic membrane and ear canal normal.  Mouth/Throat: Mucous membranes are normal. Posterior oropharyngeal edema and posterior oropharyngeal erythema present. No tonsillar abscesses.  Neck: Normal range of motion.  Cardiovascular: Normal rate, regular rhythm and normal heart sounds.  Pulmonary/Chest: Effort normal and breath sounds normal.  Lymphadenopathy:    She has cervical adenopathy.  Neurological: She is alert and oriented to person, place, and time.  Psychiatric: She has a normal mood and affect. Her behavior is normal.       Assessment:     Jean Phillips was seen today for sore throat and headache.  Diagnoses and all orders for this visit:  Sore throat -     POCT rapid strep A  Other orders -     azithromycin (ZITHROMAX) 250 MG tablet; 2 tabs today then 1 tab daily x 4 more days      Plan:     Call the office if symptoms worsen or persist. OTC sore throat lozenges recommended.

## 2018-01-14 NOTE — Patient Instructions (Signed)

## 2018-02-14 DIAGNOSIS — Z124 Encounter for screening for malignant neoplasm of cervix: Secondary | ICD-10-CM | POA: Diagnosis not present

## 2018-02-14 DIAGNOSIS — Z01419 Encounter for gynecological examination (general) (routine) without abnormal findings: Secondary | ICD-10-CM | POA: Diagnosis not present

## 2018-02-14 LAB — HM PAP SMEAR: HM Pap smear: NORMAL

## 2018-02-20 ENCOUNTER — Other Ambulatory Visit: Payer: Self-pay | Admitting: Family Medicine

## 2018-02-20 MED FILL — PROPRANOLOL 10 MG TABLET: 10 | 30 days supply | Qty: 60 | Fill #0

## 2018-03-07 ENCOUNTER — Encounter: Payer: Self-pay | Admitting: Family Medicine

## 2018-03-23 MED FILL — PROPRANOLOL 10 MG TABLET: 10 | 30 days supply | Qty: 60 | Fill #1

## 2018-03-31 MED FILL — NORETHIND-ETH ESTRAD 1-0.02: 1-20 | 84 days supply | Qty: 84 | Fill #0

## 2018-04-28 ENCOUNTER — Ambulatory Visit (INDEPENDENT_AMBULATORY_CARE_PROVIDER_SITE_OTHER): Payer: 59 | Admitting: Family Medicine

## 2018-04-28 DIAGNOSIS — I951 Orthostatic hypotension: Secondary | ICD-10-CM | POA: Diagnosis not present

## 2018-04-28 DIAGNOSIS — M545 Low back pain, unspecified: Secondary | ICD-10-CM | POA: Insufficient documentation

## 2018-04-28 DIAGNOSIS — G90A Postural orthostatic tachycardia syndrome (POTS): Secondary | ICD-10-CM

## 2018-04-28 DIAGNOSIS — G43909 Migraine, unspecified, not intractable, without status migrainosus: Secondary | ICD-10-CM | POA: Diagnosis not present

## 2018-04-28 DIAGNOSIS — R000 Tachycardia, unspecified: Secondary | ICD-10-CM | POA: Diagnosis not present

## 2018-04-28 MED ORDER — PROPRANOLOL HCL 10 MG PO TABS: 10.0000 mg | ORAL_TABLET | Freq: Two times a day (BID) | ORAL | 2 refills | Status: DC

## 2018-04-28 MED ORDER — PROMETHAZINE HCL 25 MG PO TABS: 12.5000 mg | ORAL_TABLET | Freq: Four times a day (QID) | ORAL | 1 refills | Status: DC | PRN

## 2018-04-28 MED ORDER — RIZATRIPTAN BENZOATE 10 MG PO TABS: 10.0000 mg | ORAL_TABLET | ORAL | 0 refills | Status: DC | PRN

## 2018-04-28 MED FILL — PROPRANOLOL 10 MG TABLET: 10 | 30 days supply | Qty: 60 | Fill #2

## 2018-04-28 MED FILL — PROMETHAZINE 25 MG TABLET: 25 | 5 days supply | Qty: 20 | Fill #0

## 2018-04-28 NOTE — Patient Instructions (Signed)

## 2018-04-30 NOTE — Assessment & Plan Note (Signed)
Has continued to struggle with back pain and notes massages help more than chiropractic or medications, is given a prescription for massage therapy

## 2018-04-30 NOTE — Assessment & Plan Note (Signed)
No concerning episodes on propranolol recently

## 2018-04-30 NOTE — Progress Notes (Signed)
Subjective:    Patient ID: Jean Phillips, female    DOB: 06/04/1991, 26 y.o.   MRN: [id number]  No chief complaint on file.   HPI Patient is in today for follow-up.  Overall she is doing well.  No recent febrile illness or hospitalization.  From January to June she had been doing a very good job eating well and exercising and as a result had lost a good bit of weight.  Was sleeping better and her headaches greatly improved.  Then unfortunately her father died unexpectedly and with the stress she began to eat more carbohydrates and exercise less.  She is now having trouble sleeping and notes her headaches have worsened again.  She has not tried the Maxalt but she does believe the propranolol has been helpful.  He continues to struggle with back pain and notes that the only thing that seems to help his massage therapy. Denies CP/palp/SOB/HA/congestion/fevers/GI or GU c/o. Taking meds as prescribed  Past Medical History:  Diagnosis Date  . Anemia   . Endometriosis   . Endometriosis   . Orthostatic hypotension   . POTS (postural orthostatic tachycardia syndrome)   . Subchorionic hematoma     Past Surgical History:  Procedure Laterality Date  . ENDOMETRIAL ABLATION    . LAPAROSCOPIC OVARIAN CYSTECTOMY      Family History  Problem Relation Age of Onset  . Diabetes Maternal Grandmother   . Thyroid disease Maternal Grandmother   . Hypertension Maternal Grandmother   . Diabetes Maternal Grandfather   . Hypertension Maternal Grandfather   . Hypertension Father   . Heart disease Father        s/p MI in 2014  . Alcohol abuse Father   . Dementia Father   . Diabetes Mother   . Obesity Mother        s/p gastric bypass  . Hyperthyroidism Sister     Social History   Socioeconomic History  . Marital status: Married    Spouse name: Not on file  . Number of children: Not on file  . Years of education: Not on file  . Highest education level: Not on file  Occupational History  . Not  on file  Social Needs  . Financial resource strain: Not on file  . Food insecurity:    Worry: Not on file    Inability: Not on file  . Transportation needs:    Medical: Not on file    Non-medical: Not on file  Tobacco Use  . Smoking status: Former Games developer  . Smokeless tobacco: Never Used  Substance and Sexual Activity  . Alcohol use: No  . Drug use: No  . Sexual activity: Yes  Lifestyle  . Physical activity:    Days per week: Not on file    Minutes per session: Not on file  . Stress: Not on file  Relationships  . Social connections:    Talks on phone: Not on file    Gets together: Not on file    Attends religious service: Not on file    Active member of club or organization: Not on file    Attends meetings of clubs or organizations: Not on file    Relationship status: Not on file  . Intimate partner violence:    Fear of current or ex partner: Not on file    Emotionally abused: Not on file    Physically abused: Not on file    Forced sexual activity: Not on file  Other Topics  Concern  . 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    Outpatient Medications Prior to Visit  Medication Sig Dispense Refill  . norethindrone-ethinyl estradiol (LARIN 1/20) 1-20 MG-MCG tablet Take 1 tablet by mouth daily. 1 Package 11  . propranolol (INDERAL) 10 MG tablet TAKE 1 TABLET BY MOUTH 2 TIMES DAILY. 60 tablet 2  . albuterol (PROVENTIL HFA;VENTOLIN HFA) 108 (90 Base) MCG/ACT inhaler Inhale 2 puffs into the lungs every 6 (six) hours as needed for wheezing or shortness of breath. (Patient not taking: Reported on 01/14/2018) 1 Inhaler 2  . azithromycin (ZITHROMAX) 250 MG tablet 2 tabs today then 1 tab daily x 4 more days 6 tablet 0  . benzonatate (TESSALON PERLES) 100 MG capsule Take 1-2 capsules (100-200 mg total) by mouth every 8 (eight) hours as needed for cough. (Patient not taking: Reported on 01/14/2018)  30 capsule 0  . predniSONE (DELTASONE) 5 MG tablet Take 1 tablet (5 mg total) by mouth as directed. Taper 6,5,4,3,2,1 (Patient not taking: Reported on 01/14/2018) 21 tablet 0  . promethazine (PHENERGAN) 25 MG tablet Take 0.5-1 tablets (12.5-25 mg total) by mouth every 6 (six) hours as needed for nausea or vomiting. (Patient not taking: Reported on 01/14/2018) 20 tablet 1  . rizatriptan (MAXALT) 10 MG tablet Take 1 tablet (10 mg total) by mouth as needed for migraine. May repeat in 2 hours if needed (Patient not taking: Reported on 01/14/2018) 12 tablet 0   No facility-administered medications prior to visit.     No Known Allergies  Review of Systems  Constitutional: Negative for fever and malaise/fatigue.  HENT: Negative for congestion.   Eyes: Negative for blurred vision.  Respiratory: Negative for shortness of breath.   Cardiovascular: Negative for chest pain, palpitations and leg swelling.  Gastrointestinal: Negative for abdominal pain, blood in stool and nausea.  Genitourinary: Negative for dysuria and frequency.  Musculoskeletal: Positive for back pain. Negative for falls.  Skin: Negative for rash.  Neurological: Positive for headaches. Negative for dizziness and loss of consciousness.  Endo/Heme/Allergies: Negative for environmental allergies.  Psychiatric/Behavioral: Negative for depression. The patient is not nervous/anxious.        Objective:    Physical Exam  Constitutional: She is oriented to person, place, and time. She appears well-developed and well-nourished. No distress.  HENT:  Head: Normocephalic and atraumatic.  Nose: Nose normal.  Eyes: Right eye exhibits no discharge. Left eye exhibits no discharge.  Neck: Normal range of motion. Neck supple.  Cardiovascular: Normal rate and regular rhythm.  No murmur heard. Pulmonary/Chest: Effort normal and breath sounds normal.  Abdominal: Soft. Bowel sounds are normal. There is no tenderness.  Musculoskeletal: She exhibits  no edema.  Neurological: She is alert and oriented to person, place, and time.  Skin: Skin is warm and dry.  Psychiatric: She has a normal mood and affect.  Nursing note and vitals reviewed.   BP 98/62 (BP Location: Left Arm, Patient Position: Sitting, Cuff Size: Normal)   Pulse 76   Temp 98.4 F (36.9 C) (Oral)   Resp 18   Wt 170 lb 12.8 oz (77.5 kg)   SpO2 98%   BMI 29.32 kg/m  Wt Readings from Last 3 Encounters:  04/28/18 170 lb 12.8 oz (77.5 kg)  01/14/18 163 lb 6.4 oz (74.1 kg)  07/08/17 180 lb 9.6 oz (81.9 kg)  Lab Results  Component Value Date   WBC 5.7 07/08/2017   HGB 13.7 07/08/2017   HCT 41.9 07/08/2017   PLT 327.0 07/08/2017   GLUCOSE 87 07/08/2017   CHOL 183 05/10/2016   TRIG 152.0 (H) 05/10/2016   HDL 56.00 05/10/2016   LDLCALC 97 05/10/2016   ALT 10 07/08/2017   AST 11 07/08/2017   NA 139 07/08/2017   K 4.1 07/08/2017   CL 105 07/08/2017   CREATININE 0.79 07/08/2017   BUN 10 07/08/2017   CO2 28 07/08/2017   TSH 2.83 07/08/2017   HGBA1C 5.1 05/10/2016    Lab Results  Component Value Date   TSH 2.83 07/08/2017   Lab Results  Component Value Date   WBC 5.7 07/08/2017   HGB 13.7 07/08/2017   HCT 41.9 07/08/2017   MCV 88.7 07/08/2017   PLT 327.0 07/08/2017   Lab Results  Component Value Date   NA 139 07/08/2017   K 4.1 07/08/2017   CO2 28 07/08/2017   GLUCOSE 87 07/08/2017   BUN 10 07/08/2017   CREATININE 0.79 07/08/2017   BILITOT 0.3 07/08/2017   ALKPHOS 50 07/08/2017   AST 11 07/08/2017   ALT 10 07/08/2017   PROT 6.5 07/08/2017   ALBUMIN 3.9 07/08/2017   CALCIUM 8.7 07/08/2017   GFR 94.41 07/08/2017   Lab Results  Component Value Date   CHOL 183 05/10/2016   Lab Results  Component Value Date   HDL 56.00 05/10/2016   Lab Results  Component Value Date   LDLCALC 97 05/10/2016   Lab Results  Component Value Date   TRIG 152.0 (H) 05/10/2016   Lab Results  Component Value Date   CHOLHDL 3 05/10/2016   Lab Results    Component Value Date   HGBA1C 5.1 05/10/2016       Assessment & Plan:   Problem List Items Addressed This Visit    POTS (postural orthostatic tachycardia syndrome)    No concerning episodes on propranolol recently      Relevant Medications   propranolol (INDERAL) 10 MG tablet   Sinus tachycardia    RRR today      Migraine headache    Her migraines had been better with Propranolol, weight loss, exercise and better sleep then in June she lost he father unexpectedly. She has gained the weight back and she is not exercising or sleeping well. She has not tried the Maxalt yet as a result but willing to do so is given a refill      Relevant Medications   rizatriptan (MAXALT) 10 MG tablet   propranolol (INDERAL) 10 MG tablet   Low back pain    Has continued to struggle with back pain and notes massages help more than chiropractic or medications, is given a prescription for massage therapy          I have discontinued Sharley Etcheverry's benzonatate, predniSONE, albuterol, and azithromycin. I have also changed her propranolol. Additionally, I am having her maintain her norethindrone-ethinyl estradiol, promethazine, and rizatriptan.  Meds ordered this encounter  Medications  . promethazine (PHENERGAN) 25 MG tablet    Sig: Take 0.5-1 tablets (12.5-25 mg total) by mouth every 6 (six) hours as needed for nausea or vomiting.    Dispense:  20 tablet    Refill:  1  . rizatriptan (MAXALT) 10 MG tablet    Sig: Take 1 tablet (10 mg total) by mouth as needed for migraine. May repeat in 2 hours if needed  Dispense:  12 tablet    Refill:  0  . propranolol (INDERAL) 10 MG tablet    Sig: Take 1 tablet (10 mg total) by mouth 2 (two) times daily.    Dispense:  180 tablet    Refill:  2     Danise Edge, MD

## 2018-04-30 NOTE — Assessment & Plan Note (Signed)
Her migraines had been better with Propranolol, weight loss, exercise and better sleep then in June she lost he father unexpectedly. She has gained the weight back and she is not exercising or sleeping well. She has not tried the Maxalt yet as a result but willing to do so is given a refill

## 2018-04-30 NOTE — Assessment & Plan Note (Signed)
RRR today 

## 2018-05-31 MED FILL — PROPRANOLOL 10 MG TABLET: 10 | 90 days supply | Qty: 180 | Fill #0

## 2018-06-05 ENCOUNTER — Telehealth: Payer: Self-pay | Admitting: Family Medicine

## 2018-06-05 NOTE — Telephone Encounter (Signed)
Pt dropped off form for Flex[employer] Spending account to be used for a prescription Dr. Abner GreenspanBlyth gave her. Pt would like to pick up form. [phone]. Form is located in Dr. Mariel AloeBlyth's bin in the front office.

## 2018-06-07 NOTE — Telephone Encounter (Signed)
Received [employer] Spending Account Letter of Medical Necessity from [facility] for Rx written by provider; forwarded to provider who is out of office until Monday, 06/12/18//SLS 12/04

## 2018-07-10 MED FILL — NORETHIND-ETH ESTRAD 1-0.02: 1-20 | 84 days supply | Qty: 84 | Fill #1

## 2018-07-21 ENCOUNTER — Other Ambulatory Visit: Payer: Self-pay | Admitting: Family Medicine

## 2018-07-21 MED FILL — SUMATRIPTAN SUCC 50 MG TAB: 50 | 30 days supply | Qty: 18 | Fill #0

## 2018-08-28 MED FILL — PROPRANOLOL 10 MG TABLET: 10 | 90 days supply | Qty: 180 | Fill #1

## 2018-10-02 MED FILL — NORETHIND-ETH ESTRAD 1-0.02: 1-20 | 84 days supply | Qty: 84 | Fill #2

## 2018-12-01 MED FILL — PROMETHAZINE 25 MG TABLET: 25 | 5 days supply | Qty: 20 | Fill #1

## 2018-12-01 MED FILL — PROPRANOLOL 10 MG TABLET: 10 | 90 days supply | Qty: 180 | Fill #2

## 2018-12-01 MED FILL — SUMATRIPTAN SUCC 50 MG TAB: 50 | 30 days supply | Qty: 18 | Fill #1

## 2018-12-19 MED FILL — NORETHIND-ETH ESTRAD 1-0.02: 1-20 | 63 days supply | Qty: 63 | Fill #3

## 2019-02-23 MED FILL — NORETHIND-ETH ESTRAD 1-0.02: 1-20 | 63 days supply | Qty: 63 | Fill #0

## 2019-03-02 DIAGNOSIS — Z01419 Encounter for gynecological examination (general) (routine) without abnormal findings: Secondary | ICD-10-CM | POA: Diagnosis not present

## 2019-03-13 ENCOUNTER — Other Ambulatory Visit: Payer: Self-pay | Admitting: Family Medicine

## 2019-03-13 MED FILL — PROPRANOLOL 10 MG TABLET: 10 | 90 days supply | Qty: 180 | Fill #0

## 2019-04-25 MED FILL — NORETHIND-ETH ESTRAD 1-0.02: 1-20 | 63 days supply | Qty: 63 | Fill #0

## 2019-04-28 ENCOUNTER — Other Ambulatory Visit: Payer: Self-pay | Admitting: Family Medicine

## 2019-04-28 MED FILL — SUMATRIPTAN SUCC 50 MG TAB: 50 | 7 days supply | Qty: 4 | Fill #2

## 2019-05-01 DIAGNOSIS — S0511XA Contusion of eyeball and orbital tissues, right eye, initial encounter: Secondary | ICD-10-CM | POA: Diagnosis not present

## 2019-05-01 DIAGNOSIS — H5319 Other subjective visual disturbances: Secondary | ICD-10-CM | POA: Diagnosis not present

## 2019-05-01 DIAGNOSIS — H52223 Regular astigmatism, bilateral: Secondary | ICD-10-CM | POA: Diagnosis not present

## 2019-05-01 DIAGNOSIS — H5203 Hypermetropia, bilateral: Secondary | ICD-10-CM | POA: Diagnosis not present

## 2019-06-25 MED FILL — NORETHIND-ETH ESTRAD 1-0.02: 1-20 | 63 days supply | Qty: 63 | Fill #1

## 2019-06-25 MED FILL — SUMATRIPTAN SUCC 50 MG TAB: 50 | 7 days supply | Qty: 4 | Fill #0

## 2019-06-25 MED FILL — PROPRANOLOL 10 MG TABLET: 10 | 90 days supply | Qty: 180 | Fill #1

## 2019-08-10 MED FILL — SUMATRIPTAN SUCC 50 MG TAB: 50 | 7 days supply | Qty: 4 | Fill #1

## 2019-09-01 MED FILL — NORETHIND-ETH ESTRAD 1-0.02: 1-20 | 63 days supply | Qty: 63 | Fill #2

## 2019-10-03 ENCOUNTER — Telehealth: Payer: Self-pay | Admitting: Family Medicine

## 2019-10-03 ENCOUNTER — Other Ambulatory Visit: Payer: Self-pay | Admitting: Family Medicine

## 2019-10-03 MED ORDER — PROPRANOLOL HCL 10 MG PO TABS: 10.0000 mg | ORAL_TABLET | Freq: Two times a day (BID) | ORAL | 0 refills | Status: DC

## 2019-10-03 MED FILL — PROMETHAZINE 25 MG TABLET: 25 | 5 days supply | Qty: 20 | Fill #0

## 2019-10-03 MED FILL — PROPRANOLOL 10 MG TABLET: 10 | 90 days supply | Qty: 180 | Fill #2

## 2019-10-03 MED FILL — SUMATRIPTAN SUCC 50 MG TAB: 50 | 30 days supply | Qty: 4 | Fill #0

## 2019-10-03 NOTE — Telephone Encounter (Signed)
Patient has appt for June.  Rxs sent in.

## 2019-10-03 NOTE — Telephone Encounter (Signed)
Medication: SUMAtriptan (IMITREX) 50 MG tablet [[id number]]  propranolol (INDERAL) 10 MG tablet [[id number]] promethazine (PHENERGAN) 25 MG tablet [[id number]]     Has the patient contacted their pharmacy? No. (If no, request that the patient contact the pharmacy for the refill.) (If yes, when and what did the pharmacy advise?)  Preferred Pharmacy (with phone number or street name):  [facility] - [place], [place] - [address]  [address] [place], [place] [address]  Phone:  [phone] Fax:  [phone]    Agent: Please be advised that RX refills may take up to 3 business days. We ask that you follow-up with your pharmacy.

## 2019-11-20 ENCOUNTER — Telehealth: Payer: 59 | Admitting: Physician Assistant

## 2019-11-20 DIAGNOSIS — J329 Chronic sinusitis, unspecified: Secondary | ICD-10-CM | POA: Diagnosis not present

## 2019-11-20 MED ORDER — AMOXICILLIN-POT CLAVULANATE 875-125 MG PO TABS: 1.0000 | ORAL_TABLET | Freq: Two times a day (BID) | ORAL | 0 refills | Status: AC

## 2019-11-20 MED FILL — AMOX-CLAV 875-125 MG TABLET: 875-125 | 7 days supply | Qty: 14 | Fill #0

## 2019-11-20 NOTE — Progress Notes (Signed)

## 2019-12-08 MED FILL — SUMATRIPTAN SUCC 50 MG TAB: 50 | 30 days supply | Qty: 4 | Fill #1

## 2020-01-01 ENCOUNTER — Encounter: Payer: Self-pay | Admitting: Family Medicine

## 2020-01-01 ENCOUNTER — Ambulatory Visit (INDEPENDENT_AMBULATORY_CARE_PROVIDER_SITE_OTHER): Payer: 59 | Admitting: Family Medicine

## 2020-01-01 ENCOUNTER — Other Ambulatory Visit: Payer: Self-pay

## 2020-01-01 ENCOUNTER — Other Ambulatory Visit: Payer: Self-pay | Admitting: Family Medicine

## 2020-01-01 VITALS — BP 106/66 | HR 81 | Temp 98.5°F | Resp 12 | Ht 64.4 in | Wt 182.0 lb

## 2020-01-01 DIAGNOSIS — Z0000 Encounter for general adult medical examination without abnormal findings: Secondary | ICD-10-CM

## 2020-01-01 DIAGNOSIS — G43909 Migraine, unspecified, not intractable, without status migrainosus: Secondary | ICD-10-CM

## 2020-01-01 DIAGNOSIS — R000 Tachycardia, unspecified: Secondary | ICD-10-CM | POA: Diagnosis not present

## 2020-01-01 DIAGNOSIS — F321 Major depressive disorder, single episode, moderate: Secondary | ICD-10-CM | POA: Diagnosis not present

## 2020-01-01 DIAGNOSIS — L578 Other skin changes due to chronic exposure to nonionizing radiation: Secondary | ICD-10-CM | POA: Diagnosis not present

## 2020-01-01 DIAGNOSIS — E162 Hypoglycemia, unspecified: Secondary | ICD-10-CM

## 2020-01-01 LAB — COMPREHENSIVE METABOLIC PANEL
ALT: 13 U/L (ref 0–35)
AST: 13 U/L (ref 0–37)
Albumin: 4.3 g/dL (ref 3.5–5.2)
Alkaline Phosphatase: 45 U/L (ref 39–117)
BUN: 15 mg/dL (ref 6–23)
CO2: 28 mEq/L (ref 19–32)
Calcium: 9.4 mg/dL (ref 8.4–10.5)
Chloride: 102 mEq/L (ref 96–112)
Creatinine, Ser: 0.76 mg/dL (ref 0.40–1.20)
GFR: 91.1 mL/min (ref >60.00)
Glucose, Bld: 83 mg/dL (ref 70–99)
Potassium: 4.3 mEq/L (ref 3.5–5.1)
Sodium: 137 mEq/L (ref 135–145)
Total Bilirubin: 0.6 mg/dL (ref 0.2–1.2)
Total Protein: 6.8 g/dL (ref 6.0–8.3)

## 2020-01-01 LAB — CBC
HCT: 40.9 % (ref 36.0–46.0)
Hemoglobin: 13.7 g/dL (ref 12.0–15.0)
MCHC: 33.6 g/dL (ref 30.0–36.0)
MCV: 89 fl (ref 78.0–100.0)
Platelets: 331 10*3/uL (ref 150.0–400.0)
RBC: 4.59 Mil/uL (ref 3.87–5.11)
RDW: 13 % (ref 11.5–15.5)
WBC: 6.1 10*3/uL (ref 4.0–10.5)

## 2020-01-01 LAB — TSH: TSH: 1.22 u[IU]/mL (ref 0.35–4.50)

## 2020-01-01 MED ORDER — FLUCONAZOLE 150 MG PO TABS: 150.0000 mg | ORAL_TABLET | ORAL | 1 refills | Status: DC

## 2020-01-01 MED ORDER — SUMATRIPTAN SUCCINATE 100 MG PO TABS
100.0000 mg | ORAL_TABLET | ORAL | 2 refills | Status: DC | PRN
Start: 2020-01-01 — End: 2020-06-02

## 2020-01-01 MED ORDER — PROPRANOLOL HCL 10 MG PO TABS: 10.0000 mg | ORAL_TABLET | Freq: Two times a day (BID) | ORAL | 3 refills | Status: DC

## 2020-01-01 MED FILL — SUMATRIPTAN SUCC 100 MG TAB: 100 | 30 days supply | Qty: 9 | Fill #0

## 2020-01-01 MED FILL — FLUCONAZOLE 150 MG TABS: 150 | 14 days supply | Qty: 2 | Fill #0

## 2020-01-01 MED FILL — PROPRANOLOL 10 MG TABLET: 10 | 90 days supply | Qty: 180 | Fill #0

## 2020-01-01 NOTE — Patient Instructions (Addendum)
[employer]ron Blood Pressure cuff, upper arm, want BP 100-140/60-90 Pulse oximeter, want oxygen in 90s  Weekly vitals  Take Multivitamin with minerals, selenium Vitamin D 1000-2000 IU daily Probiotic with lactobacillus and bifidophilus Asprin EC 81 mg daily  Melatonin 2-5 mg at bedtime  [email or web address]  MIND diet Preventive Care 34-27 Years Old, Female Preventive care refers to visits with your health care provider and lifestyle choices that can promote health and wellness. This includes:  A yearly physical exam. This may also be called an annual well check.  Regular dental visits and eye exams.  Immunizations.  Screening for certain conditions.  Healthy lifestyle choices, such as eating a healthy diet, getting regular exercise, not using drugs or products that contain nicotine and tobacco, and limiting alcohol use. What can I expect for my preventive care visit? Physical exam Your health care provider will check your:  Height and weight. This may be used to calculate body mass index (BMI), which tells if you are at a healthy weight.  Heart rate and blood pressure.  Skin for abnormal spots. Counseling Your health care provider may ask you questions about your:  Alcohol, tobacco, and drug use.  Emotional well-being.  Home and relationship well-being.  Sexual activity.  Eating habits.  Work and work Statistician.  Method of birth control.  Menstrual cycle.  Pregnancy history. What immunizations do I need?  Influenza (flu) vaccine  This is recommended every year. Tetanus, diphtheria, and pertussis (Tdap) vaccine  You may need a Td booster every 10 years. Varicella (chickenpox) vaccine  You may need this if you have not been vaccinated. Human papillomavirus (HPV) vaccine  If recommended by your health care provider, you may need three doses over 6 months. Measles, mumps, and rubella (MMR) vaccine  You may need at least  one dose of MMR. You may also need a second dose. Meningococcal conjugate (MenACWY) vaccine  One dose is recommended if you are age 73-21 years and a first-year college student living in a residence hall, or if you have one of several medical conditions. You may also need additional booster doses. Pneumococcal conjugate (PCV13) vaccine  You may need this if you have certain conditions and were not previously vaccinated. Pneumococcal polysaccharide (PPSV23) vaccine  You may need one or two doses if you smoke cigarettes or if you have certain conditions. Hepatitis A vaccine  You may need this if you have certain conditions or if you travel or work in places where you may be exposed to hepatitis A. Hepatitis B vaccine  You may need this if you have certain conditions or if you travel or work in places where you may be exposed to hepatitis B. Haemophilus influenzae type b (Hib) vaccine  You may need this if you have certain conditions. You may receive vaccines as individual doses or as more than one vaccine together in one shot (combination vaccines). Talk with your health care provider about the risks and benefits of combination vaccines. What tests do I need?  Blood tests  Lipid and cholesterol levels. These may be checked every 5 years starting at age 50.  Hepatitis C test.  Hepatitis B test. Screening  Diabetes screening. This is done by checking your blood sugar (glucose) after you have not eaten for a while (fasting).  Sexually transmitted disease (STD) testing.  BRCA-related cancer screening. This may be done if you have a family history of breast, ovarian, tubal, or peritoneal cancers.  Pelvic exam and Pap test. This may be  done every 3 years starting at age 65. Starting at age 54, this may be done every 5 years if you have a Pap test in combination with an HPV test. Talk with your health care provider about your test results, treatment options, and if necessary, the need  for more tests. Follow these instructions at home: Eating and drinking   Eat a diet that includes fresh fruits and vegetables, whole grains, lean protein, and low-fat dairy.  Take vitamin and mineral supplements as recommended by your health care provider.  Do not drink alcohol if: ? Your health care provider tells you not to drink. ? You are pregnant, may be pregnant, or are planning to become pregnant.  If you drink alcohol: ? Limit how much you have to 0-1 drink a day. ? Be aware of how much alcohol is in your drink. In the [place] one drink equals one 12 oz bottle of beer (355 mL), one 5 oz glass of wine (148 mL), or one 1 oz glass of hard liquor (44 mL). Lifestyle  Take daily care of your teeth and gums.  Stay active. Exercise for at least 30 minutes on 5 or more days each week.  Do not use any products that contain nicotine or tobacco, such as cigarettes, e-cigarettes, and chewing tobacco. If you need help quitting, ask your health care provider.  If you are sexually active, practice safe sex. Use a condom or other form of birth control (contraception) in order to prevent pregnancy and STIs (sexually transmitted infections). If you plan to become pregnant, see your health care provider for a preconception visit. What's next?  Visit your health care provider once a year for a well check visit.  Ask your health care provider how often you should have your eyes and teeth checked.  Stay up to date on all vaccines. This information is not intended to replace advice given to you by your health care provider. Make sure you discuss any questions you have with your health care provider. Document Revised: 03/02/2018 Document Reviewed: 03/02/2018 Elsevier Patient Education  2020 [employer].

## 2020-01-01 NOTE — Progress Notes (Signed)
0000

## 2020-01-07 DIAGNOSIS — L578 Other skin changes due to chronic exposure to nonionizing radiation: Secondary | ICD-10-CM | POA: Insufficient documentation

## 2020-01-07 NOTE — Assessment & Plan Note (Addendum)
Referred to dermatology for further consideration.  

## 2020-01-07 NOTE — Progress Notes (Signed)
Patient ID: Jean Phillips, female   DOB: Jun 20, 1991, 29 y.o.   MRN: [id number]   Subjective:    Patient ID: Jean Phillips, female    DOB: 1991-05-28, 29 y.o.   MRN: [id number]  Chief Complaint  Patient presents with  . Annual Exam    HPI Patient is in today for annual preventative exam. No recent febrile illness or hospitaliizations. She is feeling well. She continues to stay active and tries to maintain a heart healthy diet. No acute concerns. Denies CP/palp/SOB/HA/congestion/fevers/GI or GU c/o. Taking meds as prescribed  Past Medical History:  Diagnosis Date  . Anemia   . Endometriosis   . Endometriosis   . Orthostatic hypotension   . POTS (postural orthostatic tachycardia syndrome)   . Subchorionic hematoma     Past Surgical History:  Procedure Laterality Date  . ENDOMETRIAL ABLATION    . LAPAROSCOPIC OVARIAN CYSTECTOMY      Family History  Problem Relation Age of Onset  . Diabetes Maternal Grandmother   . Thyroid disease Maternal Grandmother        hypo, hyper  . Hypertension Maternal Grandmother   . Diabetes Maternal Grandfather   . Hypertension Maternal Grandfather   . Hypertension Father   . Heart disease Father        s/p MI in 2014, MI  . Alcohol abuse Father   . Dementia Father   . Diabetes Mother   . Obesity Mother        s/p gastric bypass  . Hypertension Mother   . Hyperthyroidism Sister   . Thyroid disease Sister        hyper    Social History   Socioeconomic History  . Marital status: Married    Spouse name: Not on file  . Number of children: Not on file  . Years of education: Not on file  . Highest education level: Not on file  Occupational History  . Not on file  Tobacco Use  . Smoking status: Former Games developer  . Smokeless tobacco: Never Used  Substance and Sexual Activity  . Alcohol use: No  . Drug use: No  . Sexual activity: Yes  Other Topics Concern  . Not on file  Social History Narrative   Lives with husband, son and in laws     Works with [employer], RN 3S   No dietary restrictions.   Social Determinants of Health   Financial Resource Strain:   . Difficulty of Paying Living Expenses:   Food Insecurity:   . Worried About Programme researcher, broadcasting/film/video in the Last Year:   . Barista in the Last Year:   Transportation Needs:   . Freight forwarder (Medical):   Marland Kitchen Lack of Transportation (Non-Medical):   Physical Activity:   . Days of Exercise per Week:   . Minutes of Exercise per Session:   Stress:   . Feeling of Stress :   Social Connections:   . Frequency of Communication with Friends and Family:   . Frequency of Social Gatherings with Friends and Family:   . Attends Religious Services:   . Active Member of Clubs or Organizations:   . Attends Banker Meetings:   Marland Kitchen Marital Status:   Intimate Partner Violence:   . Fear of Current or Ex-Partner:   . Emotionally Abused:   Marland Kitchen Physically Abused:   . Sexually Abused:     Outpatient Medications Prior to Visit  Medication Sig Dispense Refill  . norethindrone-ethinyl estradiol [name]  1/20) 1-20 MG-MCG tablet Take 1 tablet by mouth daily. 1 Package 11  . promethazine (PHENERGAN) 25 MG tablet TAKE 1/2-1 TABLETS (12.5-25 MG TOTAL) BY MOUTH EVERY 6 HOURS AS NEEDED FOR NAUSEA OR VOMITING. 20 tablet 1  . propranolol (INDERAL) 10 MG tablet Take 1 tablet (10 mg total) by mouth 2 (two) times daily. 180 tablet 0  . SUMAtriptan (IMITREX) 50 MG tablet TAKE 1 TABLET BY MOUTH EVERY 2 HOURS AS NEEDED FOR MIGRAINE. MAY REPEAT IN 2 HOURS IF HEADACHE PERSISTS OR RECURS **MAX OF 4 TABS PER 24 HOURS** 4 tablet 1  . rizatriptan (MAXALT) 10 MG tablet Take 1 tablet (10 mg total) by mouth as needed for migraine. May repeat in 2 hours if needed 12 tablet 0   No facility-administered medications prior to visit.    No Known Allergies  Review of Systems  Constitutional: Negative for fever and malaise/fatigue.  HENT: Negative for congestion.   Eyes: Negative for blurred  vision.  Respiratory: Negative for shortness of breath.   Cardiovascular: Negative for chest pain, palpitations and leg swelling.  Gastrointestinal: Negative for abdominal pain, blood in stool and nausea.  Genitourinary: Negative for dysuria and frequency.  Musculoskeletal: Negative for falls.  Skin: Negative for rash.  Neurological: Negative for dizziness, loss of consciousness and headaches.  Endo/Heme/Allergies: Negative for environmental allergies.  Psychiatric/Behavioral: Negative for depression. The patient is not nervous/anxious.        Objective:    Physical Exam Constitutional:      General: She is not in acute distress.    Appearance: She is not diaphoretic.  HENT:     Head: Normocephalic and atraumatic.     Right Ear: External ear normal.     Left Ear: External ear normal.     Nose: Nose normal.     Mouth/Throat:     Pharynx: No oropharyngeal exudate.  Eyes:     General: No scleral icterus.       Right eye: No discharge.        Left eye: No discharge.     Conjunctiva/sclera: Conjunctivae normal.     Pupils: Pupils are equal, round, and reactive to light.  Neck:     Thyroid: No thyromegaly.  Cardiovascular:     Rate and Rhythm: Normal rate and regular rhythm.     Heart sounds: Normal heart sounds. No murmur heard.   Pulmonary:     Effort: Pulmonary effort is normal. No respiratory distress.     Breath sounds: Normal breath sounds. No wheezing or rales.  Abdominal:     General: Bowel sounds are normal. There is no distension.     Palpations: Abdomen is soft. There is no mass.     Tenderness: There is no abdominal tenderness.  Musculoskeletal:        General: No tenderness. Normal range of motion.     Cervical back: Normal range of motion and neck supple.  Lymphadenopathy:     Cervical: No cervical adenopathy.  Skin:    General: Skin is warm and dry.     Findings: No rash.  Neurological:     Mental Status: She is alert and oriented to person, place, and  time.     Cranial Nerves: No cranial nerve deficit.     Coordination: Coordination normal.     Deep Tendon Reflexes: Reflexes are normal and symmetric. Reflexes normal.     BP 106/66 (BP Location: Left Arm, Cuff Size: Normal)   Pulse 81   Temp  98.5 F (36.9 C) (Temporal)   Resp 12   Ht 5' 4.4" (1.636 m)   Wt 182 lb (82.6 kg)   SpO2 98%   BMI 30.85 kg/m  Wt Readings from Last 3 Encounters:  01/01/20 182 lb (82.6 kg)  04/28/18 170 lb 12.8 oz (77.5 kg)  01/14/18 163 lb 6.4 oz (74.1 kg)    Diabetic Foot Exam - Simple   No data filed     Lab Results  Component Value Date   WBC 6.1 01/01/2020   HGB 13.7 01/01/2020   HCT 40.9 01/01/2020   PLT 331.0 01/01/2020   GLUCOSE 83 01/01/2020   CHOL 183 05/10/2016   TRIG 152.0 (H) 05/10/2016   HDL 56.00 05/10/2016   LDLCALC 97 05/10/2016   ALT 13 01/01/2020   AST 13 01/01/2020   NA 137 01/01/2020   K 4.3 01/01/2020   CL 102 01/01/2020   CREATININE 0.76 01/01/2020   BUN 15 01/01/2020   CO2 28 01/01/2020   TSH 1.22 01/01/2020   HGBA1C 5.1 05/10/2016    Lab Results  Component Value Date   TSH 1.22 01/01/2020   Lab Results  Component Value Date   WBC 6.1 01/01/2020   HGB 13.7 01/01/2020   HCT 40.9 01/01/2020   MCV 89.0 01/01/2020   PLT 331.0 01/01/2020   Lab Results  Component Value Date   NA 137 01/01/2020   K 4.3 01/01/2020   CO2 28 01/01/2020   GLUCOSE 83 01/01/2020   BUN 15 01/01/2020   CREATININE 0.76 01/01/2020   BILITOT 0.6 01/01/2020   ALKPHOS 45 01/01/2020   AST 13 01/01/2020   ALT 13 01/01/2020   PROT 6.8 01/01/2020   ALBUMIN 4.3 01/01/2020   CALCIUM 9.4 01/01/2020   GFR 91.10 01/01/2020   Lab Results  Component Value Date   CHOL 183 05/10/2016   Lab Results  Component Value Date   HDL 56.00 05/10/2016   Lab Results  Component Value Date   LDLCALC 97 05/10/2016   Lab Results  Component Value Date   TRIG 152.0 (H) 05/10/2016   Lab Results  Component Value Date   CHOLHDL 3 05/10/2016    Lab Results  Component Value Date   HGBA1C 5.1 05/10/2016       Assessment & Plan:   Problem List Items Addressed This Visit    Hypoglycemia   Preventative health care    Patient encouraged to maintain heart healthy diet, regular exercise, adequate sleep. Consider daily probiotics. Take medications as prescribed. Labs ordered and reviewed.       Relevant Orders   CBC (Completed)   Comprehensive metabolic panel (Completed)   TSH (Completed)   Sinus tachycardia    RRR rosT      Migraine headache - Primary    Encouraged increased hydration, 64 ounces of clear fluids daily. Minimize alcohol and caffeine. Eat small frequent meals with lean proteins and complex carbs. Avoid high and low blood sugars. Get adequate sleep, 7-8 hours a night. Needs exercise daily preferably in the morning.      Relevant Medications   SUMAtriptan (IMITREX) 100 MG tablet   propranolol (INDERAL) 10 MG tablet   Other Relevant Orders   Ambulatory referral to Neurology   CBC (Completed)   Comprehensive metabolic panel (Completed)   TSH (Completed)   Sun-damaged skin    Referred to dermatology for further consideration      Relevant Orders   Ambulatory referral to Dermatology    Other Visit Diagnoses  Depression, major, single episode, moderate (HCC)          I have discontinued Maralyn SagoSarah Gottsch's rizatriptan and SUMAtriptan. I am also having her start on SUMAtriptan and fluconazole. Additionally, I am having her maintain her norethindrone-ethinyl estradiol, promethazine, and propranolol.  Meds ordered this encounter  Medications  . SUMAtriptan (IMITREX) 100 MG tablet    Sig: Take 1 tablet (100 mg total) by mouth every 2 (two) hours as needed for migraine. May repeat in 2 hours if headache persists or recurs.    Dispense:  10 tablet    Refill:  2  . fluconazole (DIFLUCAN) 150 MG tablet    Sig: Take 1 tablet (150 mg total) by mouth once a week.    Dispense:  2 tablet    Refill:  1  .  propranolol (INDERAL) 10 MG tablet    Sig: Take 1 tablet (10 mg total) by mouth 2 (two) times daily.    Dispense:  180 tablet    Refill:  3     Danise EdgeStacey Absalom Aro, MD

## 2020-01-07 NOTE — Assessment & Plan Note (Signed)
RRR rosT

## 2020-01-07 NOTE — Assessment & Plan Note (Signed)
She is very tearful in the visit. She denies any triggers but has been sadder and sadder over the past 3 months. She is started on

## 2020-01-07 NOTE — Assessment & Plan Note (Signed)
Encouraged increased hydration, 64 ounces of clear fluids daily. Minimize alcohol and caffeine. Eat small frequent meals with lean proteins and complex carbs. Avoid high and low blood sugars. Get adequate sleep, 7-8 hours a night. Needs exercise daily preferably in the morning.  

## 2020-01-07 NOTE — Assessment & Plan Note (Signed)
Patient encouraged to maintain heart healthy diet, regular exercise, adequate sleep. Consider daily probiotics. Take medications as prescribed. Labs ordered and reviewed 

## 2020-01-10 MED FILL — NORETHIND-ETH ESTRAD 1-0.02: 1-20 | 63 days supply | Qty: 63 | Fill #4

## 2020-01-15 ENCOUNTER — Telehealth: Payer: Self-pay | Admitting: Physician Assistant

## 2020-01-15 NOTE — Telephone Encounter (Signed)
Scheduled 07/08/20 @11 :00 w/KRS

## 2020-01-28 ENCOUNTER — Other Ambulatory Visit: Payer: Self-pay

## 2020-01-28 ENCOUNTER — Other Ambulatory Visit: Payer: Self-pay | Admitting: Neurology

## 2020-01-28 ENCOUNTER — Encounter: Payer: Self-pay | Admitting: Neurology

## 2020-01-28 ENCOUNTER — Ambulatory Visit: Payer: 59 | Admitting: Neurology

## 2020-01-28 VITALS — BP 112/77 | HR 79 | Ht 64.0 in | Wt 187.0 lb

## 2020-01-28 DIAGNOSIS — H539 Unspecified visual disturbance: Secondary | ICD-10-CM | POA: Diagnosis not present

## 2020-01-28 DIAGNOSIS — H538 Other visual disturbances: Secondary | ICD-10-CM

## 2020-01-28 DIAGNOSIS — R51 Headache with orthostatic component, not elsewhere classified: Secondary | ICD-10-CM | POA: Diagnosis not present

## 2020-01-28 DIAGNOSIS — G43709 Chronic migraine without aura, not intractable, without status migrainosus: Secondary | ICD-10-CM

## 2020-01-28 DIAGNOSIS — R519 Headache, unspecified: Secondary | ICD-10-CM

## 2020-01-28 MED ORDER — KETOROLAC TROMETHAMINE 60 MG/2ML IM SOLN
60.0000 mg | Freq: Once | INTRAMUSCULAR | Status: AC
  Administered 2020-01-28: 60 mg via INTRAMUSCULAR

## 2020-01-28 MED ORDER — METHYLPREDNISOLONE 4 MG PO TBPK: ORAL_TABLET | ORAL | 1 refills | Status: DC

## 2020-01-28 MED ORDER — NURTEC 75 MG PO TBDP: 75.0000 mg | ORAL_TABLET | Freq: Every day | ORAL | 6 refills | Status: DC | PRN

## 2020-01-28 MED ORDER — AJOVY 225 MG/1.5ML ~~LOC~~ SOAJ: 225.0000 mg | SUBCUTANEOUS | 11 refills | Status: DC

## 2020-01-28 MED FILL — NURTEC 75 MG TBDP: 75 | 30 days supply | Qty: 8 | Fill #0

## 2020-01-28 MED FILL — AJOVY 225 MG/1.5ML SOAJ: 225 | 90 days supply | Qty: 5 | Fill #0

## 2020-01-28 MED FILL — METHYLPREDNISOLONE 4 MG TBP: 4 | 6 days supply | Qty: 21 | Fill #0

## 2020-01-28 NOTE — Progress Notes (Signed)
[facility]    Provider:  Dr Lucia Gaskins Requesting Provider: Bradd Canary, MD Primary Care Provider:  Bradd Canary, MD  CC:  migraines  HPI:  Jean Phillips is a 29 y.o. female here as requested by Bradd Canary, MD for migraines. She has a PMHx of POTS, migraines. I reviewed Dr. Mariel Aloe notes: Patient was last seen for annual preventative exam, this was at the end of June of this year, no recent illnesses, she is feeling well, continue to stay active and maintaining a healthy diet, she discussed migraine headaches with Dr. Abner Greenspan who encouraged increased hydration, minimizing alcohol and caffeine, eating small frequent meals with lean proteins and complex carbs and avoiding high and low blood sugars, getting adequate sleep 78 hours a night, and exercising.  Physical examination was normal.  For her migraines it appears she is on Imitrex 100 mg and propranolol 10 mg.  I reviewed TSH, CMP and CBC which were all normal.  I do not see that patient had brain imaging in the notes I have available.  She has had migraines since she was a kid, she reports having them in the fourth grade and that is when they started to be migraines and not just headaches, as she is gotten older they have gotten more frequent and more severe, she is tried multiple medications if is just a headache analgesics little help if it is a migraine over-the-counter meds will not work, she has had a headache since Friday.Patient's migraines start behind te eyes maybe unilateral but either side, behind her ears and the top of the neck, temples, pulsating/pounding and throbbing and sharper, nausea, light and sound sensitivity, they can last 24-72 hours, sumatriptan may help if she sleeps, she can wake up in the morning with headaches, vision changes and blurry vision, slowly progressive through the years. Over the last year it has worsened and she has 23 headache days out of the month, and 10 -12 moderately severe to  severe migraines a month. No medication overuse. No aura. She has tried to narrow down triggers, better when she is exercing but she takes continuous birth control so not menstrual, she tried cutting out caffeine, at work they seem to be worse, blistering migraine severe, she tries to drink, she tried to eat well. No family history of migraines. No other focal neurologic deficits, associated symptoms, inciting events or modifiable factors.  From a thorough review of records, meds tried that can be used in migraine prevention include: Tylenol, propranolol, Imitrex, ibuprofen, labetalol, metoprolol, Zofran injections and tablets, Percocet, prednisone tablets, Phenergan tablets, rizatriptan,  Reviewed notes, labs and imaging from outside physicians, which showed: see above  Review of Systems: Patient complains of symptoms per HPI as well as the following symptoms:headaches. Pertinent negatives and positives per HPI. All others negative.   Social History   Socioeconomic History  . Marital status: Married    Spouse name: Not on file  . Number of children: Not on file  . Years of education: Not on file  . Highest education level: Not on file  Occupational History  . Not on file  Tobacco Use  . Smoking status: Former Games developer  . Smokeless tobacco: Never Used  Vaping Use  . Vaping Use: Never used  Substance and Sexual Activity  . Alcohol use: No  . Drug use: No  . Sexual activity: Yes  Other Topics Concern  . 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   Caffeine: 1 cup coffee in the morning and maybe once a week she has a coke.   Social Determinants of Health   Financial Resource Strain:   . Difficulty of Paying Living Expenses:   Food Insecurity:   . Worried About Programme researcher, broadcasting/film/video in the Last Year:   . Barista in the Last Year:   Transportation Needs:   . Freight forwarder (Medical):   Marland Kitchen  Lack of Transportation (Non-Medical):   Physical Activity:   . Days of Exercise per Week:   . Minutes of Exercise per Session:   Stress:   . Feeling of Stress :   Social Connections:   . Frequency of Communication with Friends and Family:   . Frequency of Social Gatherings with Friends and Family:   . Attends Religious Services:   . Active Member of Clubs or Organizations:   . Attends Banker Meetings:   Marland Kitchen Marital Status:   Intimate Partner Violence:   . Fear of Current or Ex-Partner:   . Emotionally Abused:   Marland Kitchen Physically Abused:   . Sexually Abused:     Family History  Problem Relation Age of Onset  . Diabetes Maternal Grandmother   . Thyroid disease Maternal Grandmother        hypo, hyper  . Hypertension Maternal Grandmother   . Diabetes Maternal Grandfather   . Hypertension Maternal Grandfather   . Hypertension Father   . Heart disease Father        s/p MI in 2014, MI  . Alcohol abuse Father   . Dementia Father   . Diabetes Mother   . Obesity Mother        s/p gastric bypass  . Hypertension Mother   . Hyperthyroidism Sister   . Thyroid disease Sister        hyper  . Migraines Neg Hx     Past Medical History:  Diagnosis Date  . Anemia   . Endometriosis   . Endometriosis   . Orthostatic hypotension   . POTS (postural orthostatic tachycardia syndrome)   . Subchorionic hematoma     Patient Active Problem List   Diagnosis Date Noted  . Sun-damaged skin 01/07/2020  . Low back pain 04/28/2018  . Migraine headache 07/08/2017  . Sinus tachycardia 06/18/2016  . Preventative health care 05/16/2016  . Hypoglycemia 05/10/2016  . POTS (postural orthostatic tachycardia syndrome)   . Endometriosis     Past Surgical History:  Procedure Laterality Date  . ENDOMETRIAL ABLATION    . LAPAROSCOPIC OVARIAN CYSTECTOMY      Current Outpatient Medications  Medication Sig Dispense Refill  . fluconazole (DIFLUCAN) 150 MG tablet Take 1 tablet (150 mg  total) by mouth once a week. 2 tablet 1  . Multiple Vitamin (MULTIVITAMIN PO) Take by mouth daily.    . norethindrone-ethinyl estradiol (LARIN 1/20) 1-20 MG-MCG tablet Take 1 tablet by mouth daily. 1 Package 11  . promethazine (PHENERGAN) 25 MG tablet TAKE 1/2-1 TABLETS (12.5-25 MG TOTAL) BY MOUTH EVERY 6 HOURS AS NEEDED FOR NAUSEA OR VOMITING. 20 tablet 1  . propranolol (INDERAL) 10 MG tablet Take 1 tablet (10 mg total) by mouth 2 (two) times daily. 180 tablet 3  . SUMAtriptan (IMITREX) 100 MG tablet Take 1 tablet (100 mg total) by mouth every 2 (two) hours as needed for migraine. May repeat in 2 hours if headache persists or recurs. 10  tablet 2  . Fremanezumab-vfrm (AJOVY) 225 MG/1.5ML SOAJ Inject 225 mg into the skin every 30 (thirty) days. 3 pen 11  . methylPREDNISolone (MEDROL DOSEPAK) 4 MG TBPK tablet Take pills in the morning with food for 6 days 6-5-4-3-2-1 21 tablet 1  . Rimegepant Sulfate (NURTEC) 75 MG TBDP Take 75 mg by mouth daily as needed. For migraines. Take as close to onset of migraine as possible. One daily maximum. 16 tablet 6   No current facility-administered medications for this visit.    Allergies as of 01/28/2020  . (No Known Allergies)    Vitals: BP 112/77 (BP Location: Right Arm, Patient Position: Sitting)   Pulse 79   Ht 5\' 4"  (1.626 m)   Wt 187 lb (84.8 kg)   Breastfeeding No   BMI 32.10 kg/m  Last Weight:  Wt Readings from Last 1 Encounters:  01/28/20 187 lb (84.8 kg)   Last Height:   Ht Readings from Last 1 Encounters:  01/28/20 5\' 4"  (1.626 m)     Physical exam: Exam: Gen: NAD, conversant, well nourised, obese, well groomed                     CV: RRR, no MRG. No Carotid Bruits. No peripheral edema, warm, nontender Eyes: Conjunctivae clear without exudates or hemorrhage  Neuro: Detailed Neurologic Exam  Speech:    Speech is normal; fluent and spontaneous with normal comprehension.  Cognition:    The patient is oriented to person, place,  and time;     recent and remote memory intact;     language fluent;     normal attention, concentration,     fund of knowledge Cranial Nerves:    The pupils are equal, round, and reactive to light. The fundi are normal and spontaneous venous pulsations are present. Visual fields are full to finger confrontation. Extraocular movements are intact. Trigeminal sensation is intact and the muscles of mastication are normal. The face is symmetric. The palate elevates in the midline. Hearing intact. Voice is normal. Shoulder shrug is normal. The tongue has normal motion without fasciculations.   Coordination:    No dysmetria or ataxia.   Gait:  norma native gait  Motor Observation:    No asymmetry, no atrophy, and no involuntary movements noted. Tone:    Normal muscle tone.    Posture:    Posture is normal. normal erect    Strength:    Strength is V/V in the upper and lower limbs.      Sensation: intact to LT     Reflex Exam:  DTR's:    Deep tendon reflexes in the upper and lower extremities are normal bilaterally.   Toes:    The toes are downgoing bilaterally.   Clonus:    Clonus is absent.    Assessment/Plan:  29 year old with chronic migraines without aura however given concerning symptoms she needs brain imaging. We discussed migraine management, preventative and acute options, new treatments.  MRI brain due to concerning symptoms of morning headaches, positional headaches,vision changes  to look for space occupying mass, chiari or intracranial hypertension (pseudotumor).  Preventative: Start Ajovy Acute management: Nurtec Bridge the gap: Medrol dosepak  Orders Placed This Encounter  Procedures  . MR BRAIN W WO CONTRAST   Meds ordered this encounter  Medications  . methylPREDNISolone (MEDROL DOSEPAK) 4 MG TBPK tablet    Sig: Take pills in the morning with food for 6 days 6-5-4-3-2-1    Dispense:  21 tablet    Refill:  1  . Rimegepant Sulfate (NURTEC) 75 MG TBDP     Sig: Take 75 mg by mouth daily as needed. For migraines. Take as close to onset of migraine as possible. One daily maximum.    Dispense:  16 tablet    Refill:  6    16 or max allowed by insurance  . Fremanezumab-vfrm (AJOVY) 225 MG/1.5ML SOAJ    Sig: Inject 225 mg into the skin every 30 (thirty) days.    Dispense:  3 pen    Refill:  11    Patient has copay card; she can have medication for $5 regardless of insurance approval or copay amount.  Marland Kitchen ketorolac (TORADOL) injection 60 mg   Discussed: To prevent or relieve headaches, try the following: Cool Compress. Lie down and place a cool compress on your head.  Avoid headache triggers. If certain foods or odors seem to have triggered your migraines in the past, avoid them. A headache diary might help you identify triggers.  Include physical activity in your daily routine. Try a daily walk or other moderate aerobic exercise.  Manage stress. Find healthy ways to cope with the stressors, such as delegating tasks on your to-do list.  Practice relaxation techniques. Try deep breathing, yoga, massage and visualization.  Eat regularly. Eating regularly scheduled meals and maintaining a healthy diet might help prevent headaches. Also, drink plenty of fluids.  Follow a regular sleep schedule. Sleep deprivation might contribute to headaches Consider biofeedback. With this mind-body technique, you learn to control certain bodily functions -- such as muscle tension, heart rate and blood pressure -- to prevent headaches or reduce headache pain.    Proceed to emergency room if you experience new or worsening symptoms or symptoms do not resolve, if you have new neurologic symptoms or if headache is severe, or for any concerning symptom.   Provided education and documentation from American headache Society toolbox including articles on: chronic migraine medication overuse headache, chronic migraines, prevention of migraines, behavioral and other nonpharmacologic  treatments for headache.  Cc: Bradd Canary, MD,  Bradd Canary, MD  Naomie Dean, MD  [facility] [address] [place], [place] [address]  Phone [phone] Fax [phone]

## 2020-01-28 NOTE — Progress Notes (Signed)
Toradol 60 mg IM injection administered via v.o. Dr. Lucia Gaskins in the pt's R quadriceps muscle.

## 2020-01-28 NOTE — Patient Instructions (Signed)
Preventative: Start Ajovy Acute management: Nurtec Bridge the gap: Medrol dosepak MRI of the brain w/wo contrast  Fremanezumab injection What is this medicine? FREMANEZUMAB (fre ma NEZ ue mab) is used to prevent migraine headaches. This medicine may be used for other purposes; ask your health care provider or pharmacist if you have questions. COMMON BRAND NAME(S): AJOVY What should I tell my health care provider before I take this medicine? They need to know if you have any of these conditions:  an unusual or allergic reaction to fremanezumab, other medicines, foods, dyes, or preservatives  pregnant or trying to get pregnant  breast-feeding How should I use this medicine? This medicine is for injection under the skin. You will be taught how to prepare and give this medicine. Use exactly as directed. Take your medicine at regular intervals. Do not take your medicine more often than directed. It is important that you put your used needles and syringes in a special sharps container. Do not put them in a trash can. If you do not have a sharps container, call your pharmacist or healthcare provider to get one. Talk to your pediatrician regarding the use of this medicine in children. Special care may be needed. Overdosage: If you think you have taken too much of this medicine contact a poison control center or emergency room at once. NOTE: This medicine is only for you. Do not share this medicine with others. What if I miss a dose? If you miss a dose, take it as soon as you can. If it is almost time for your next dose, take only that dose. Do not take double or extra doses. What may interact with this medicine? Interactions are not expected. This list may not describe all possible interactions. Give your health care provider a list of all the medicines, herbs, non-prescription drugs, or dietary supplements you use. Also tell them if you smoke, drink alcohol, or use illegal drugs. Some items may  interact with your medicine. What should I watch for while using this medicine? Tell your doctor or healthcare professional if your symptoms do not start to get better or if they get worse. What side effects may I notice from receiving this medicine? Side effects that you should report to your doctor or health care professional as soon as possible:  allergic reactions like skin rash, itching or hives, swelling of the face, lips, or tongue Side effects that usually do not require medical attention (report these to your doctor or health care professional if they continue or are bothersome):  pain, redness, or irritation at site where injected This list may not describe all possible side effects. Call your doctor for medical advice about side effects. You may report side effects to FDA at 1-800-FDA-1088. Where should I keep my medicine? Keep out of the reach of children. You will be instructed on how to store this medicine. Throw away any unused medicine after the expiration date on the label. NOTE: This sheet is a summary. It may not cover all possible information. If you have questions about this medicine, talk to your doctor, pharmacist, or health care provider.  2020 Elsevier/Gold Standard (2017-03-21 17:22:56) Rimegepant oral dissolving tablet What is this medicine? RIMEGEPANT (ri ME je pant) is used to treat migraine headaches with or without aura. An aura is a strange feeling or visual disturbance that warns you of an attack. It is not used to prevent migraines. This medicine may be used for other purposes; ask your health care provider or  pharmacist if you have questions. COMMON BRAND NAME(S): NURTEC ODT What should I tell my health care provider before I take this medicine? They need to know if you have any of these conditions:  kidney disease  liver disease  an unusual or allergic reaction to rimegepant, other medicines, foods, dyes, or preservatives  pregnant or trying to get  pregnant  breast-feeding How should I use this medicine? Take the medicine by mouth. Follow the directions on the prescription label. Leave the tablet in the sealed blister pack until you are ready to take it. With dry hands, open the blister and gently remove the tablet. If the tablet breaks or crumbles, throw it away and take a new tablet out of the blister pack. Place the tablet in the mouth and allow it to dissolve, and then swallow. Do not cut, crush, or chew this medicine. You do not need water to take this medicine. Talk to your pediatrician about the use of this medicine in children. Special care may be needed. Overdosage: If you think you have taken too much of this medicine contact a poison control center or emergency room at once. NOTE: This medicine is only for you. Do not share this medicine with others. What if I miss a dose? This does not apply. This medicine is not for regular use. What may interact with this medicine? This medicine may interact with the following medications:  certain medicines for fungal infections like fluconazole, itraconazole  rifampin This list may not describe all possible interactions. Give your health care provider a list of all the medicines, herbs, non-prescription drugs, or dietary supplements you use. Also tell them if you smoke, drink alcohol, or use illegal drugs. Some items may interact with your medicine. What should I watch for while using this medicine? Visit your health care professional for regular checks on your progress. Tell your health care professional if your symptoms do not start to get better or if they get worse. What side effects may I notice from receiving this medicine? Side effects that you should report to your doctor or health care professional as soon as possible:  allergic reactions like skin rash, itching or hives; swelling of the face, lips, or tongue Side effects that usually do not require medical attention (report  these to your doctor or health care professional if they continue or are bothersome):  nausea This list may not describe all possible side effects. Call your doctor for medical advice about side effects. You may report side effects to FDA at 1-800-FDA-1088. Where should I keep my medicine? Keep out of the reach of children. Store at room temperature between 15 and 30 degrees C (59 and 86 degrees F). Throw away any unused medicine after the expiration date. NOTE: This sheet is a summary. It may not cover all possible information. If you have questions about this medicine, talk to your doctor, pharmacist, or health care provider.  2020 Elsevier/Gold Standard (2018-09-04 00:21:31) Methylprednisolone tablets What is this medicine? METHYLPREDNISOLONE (meth ill pred NISS oh lone) is a corticosteroid. It is commonly used to treat inflammation of the skin, joints, lungs, and other organs. Common conditions treated include asthma, allergies, and arthritis. It is also used for other conditions, such as blood disorders and diseases of the adrenal glands. This medicine may be used for other purposes; ask your health care provider or pharmacist if you have questions. COMMON BRAND NAME(S): Medrol, Medrol Dosepak What should I tell my health care provider before I take this  medicine? They need to know if you have any of these conditions:  Cushing's syndrome  eye disease, vision problems  diabetes  glaucoma  heart disease  high blood pressure  infection (especially a virus infection such as chickenpox, cold sores, or herpes)  liver disease  mental illness  myasthenia gravis  osteoporosis  recently received or scheduled to receive a vaccine  seizures  stomach or intestine problems  thyroid disease  an unusual or allergic reaction to lactose, methylprednisolone, other medicines, foods, dyes, or preservatives  pregnant or trying to get pregnant  breast-feeding How should I use this  medicine? Take this medicine by mouth with a glass of water. Follow the directions on the prescription label. Take this medicine with food. If you are taking this medicine once a day, take it in the morning. Do not take it more often than directed. Do not suddenly stop taking your medicine because you may develop a severe reaction. Your doctor will tell you how much medicine to take. If your doctor wants you to stop the medicine, the dose may be slowly lowered over time to avoid any side effects. Talk to your pediatrician regarding the use of this medicine in children. Special care may be needed. Overdosage: If you think you have taken too much of this medicine contact a poison control center or emergency room at once. NOTE: This medicine is only for you. Do not share this medicine with others. What if I miss a dose? If you miss a dose, take it as soon as you can. If it is almost time for your next dose, talk to your doctor or health care professional. You may need to miss a dose or take an extra dose. Do not take double or extra doses without advice. What may interact with this medicine? Do not take this medicine with any of the following medications:  alefacept  echinacea  live virus vaccines  metyrapone  mifepristone This medicine may also interact with the following medications:  amphotericin B  aspirin and aspirin-like medicines  certain antibiotics like erythromycin, clarithromycin, troleandomycin  certain medicines for diabetes  certain medicines for fungal infections like ketoconazole  certain medicines for seizures like carbamazepine, phenobarbital, phenytoin  certain medicines that treat or prevent blood clots like warfarin  cholestyramine  cyclosporine  digoxin  diuretics  female hormones, like estrogens and birth control pills  isoniazid  NSAIDs, medicines for pain inflammation, like ibuprofen or naproxen  other medicines for myasthenia  gravis  rifampin  vaccines This list may not describe all possible interactions. Give your health care provider a list of all the medicines, herbs, non-prescription drugs, or dietary supplements you use. Also tell them if you smoke, drink alcohol, or use illegal drugs. Some items may interact with your medicine. What should I watch for while using this medicine? Tell your doctor or healthcare professional if your symptoms do not start to get better or if they get worse. Do not stop taking except on your doctor's advice. You may develop a severe reaction. Your doctor will tell you how much medicine to take. This medicine may increase your risk of getting an infection. Tell your doctor or health care professional if you are around anyone with measles or chickenpox, or if you develop sores or blisters that do not heal properly. This medicine may increase blood sugar levels. Ask your healthcare provider if changes in diet or medicines are needed if you have diabetes. Tell your doctor or health care professional right away  if you have any change in your eyesight. Using this medicine for a long time may increase your risk of low bone mass. Talk to your doctor about bone health. What side effects may I notice from receiving this medicine? Side effects that you should report to your doctor or health care professional as soon as possible:  allergic reactions like skin rash, itching or hives, swelling of the face, lips, or tongue  bloody or tarry stools  hallucination, loss of contact with reality  muscle cramps  muscle pain  palpitations  signs and symptoms of high blood sugar such as being more thirsty or hungry or having to urinate more than normal. You may also feel very tired or have blurry vision.  signs and symptoms of infection like fever or chills; cough; sore throat; pain or trouble passing urine Side effects that usually do not require medical attention (report to your doctor or health  care professional if they continue or are bothersome):  changes in emotions or mood  constipation  diarrhea  excessive hair growth on the face or body  headache  nausea, vomiting  trouble sleeping  weight gain This list may not describe all possible side effects. Call your doctor for medical advice about side effects. You may report side effects to FDA at 1-800-FDA-1088. Where should I keep my medicine? Keep out of the reach of children. Store at room temperature between 20 and 25 degrees C (68 and 77 degrees F). Throw away any unused medicine after the expiration date. NOTE: This sheet is a summary. It may not cover all possible information. If you have questions about this medicine, talk to your doctor, pharmacist, or health care provider.  2020 Elsevier/Gold Standard (2018-03-23 09:19:36)

## 2020-02-06 ENCOUNTER — Telehealth: Payer: Self-pay | Admitting: Neurology

## 2020-02-06 NOTE — Telephone Encounter (Signed)
LVM for pt to call back about scheduling mri  [facility] Auth: [facility] Ref # [id number]

## 2020-02-07 NOTE — Telephone Encounter (Signed)
Patient returned my call she is scheduled at [facility] for 02/13/20. 

## 2020-02-13 ENCOUNTER — Ambulatory Visit: Payer: 59

## 2020-02-13 DIAGNOSIS — H539 Unspecified visual disturbance: Secondary | ICD-10-CM | POA: Diagnosis not present

## 2020-02-13 DIAGNOSIS — R51 Headache with orthostatic component, not elsewhere classified: Secondary | ICD-10-CM | POA: Diagnosis not present

## 2020-02-13 DIAGNOSIS — R519 Headache, unspecified: Secondary | ICD-10-CM | POA: Diagnosis not present

## 2020-02-13 DIAGNOSIS — H538 Other visual disturbances: Secondary | ICD-10-CM | POA: Diagnosis not present

## 2020-02-13 MED ORDER — GADOBENATE DIMEGLUMINE 529 MG/ML IV SOLN
15.0000 mL | Freq: Once | INTRAVENOUS | Status: AC | PRN
  Administered 2020-02-13: 15 mL via INTRAVENOUS

## 2020-03-07 ENCOUNTER — Other Ambulatory Visit (HOSPITAL_COMMUNITY): Payer: Self-pay | Admitting: Obstetrics and Gynecology

## 2020-03-07 DIAGNOSIS — Z01419 Encounter for gynecological examination (general) (routine) without abnormal findings: Secondary | ICD-10-CM | POA: Diagnosis not present

## 2020-03-07 DIAGNOSIS — Z124 Encounter for screening for malignant neoplasm of cervix: Secondary | ICD-10-CM | POA: Diagnosis not present

## 2020-03-11 MED FILL — NORETHIND-ETH ESTRAD 1-0.02: 1-20 | 63 days supply | Qty: 63 | Fill #0

## 2020-04-01 DIAGNOSIS — Z23 Encounter for immunization: Secondary | ICD-10-CM | POA: Diagnosis not present

## 2020-04-14 MED FILL — PROPRANOLOL 10 MG TABLET: 10 | 90 days supply | Qty: 180 | Fill #1

## 2020-04-22 DIAGNOSIS — Z23 Encounter for immunization: Secondary | ICD-10-CM | POA: Diagnosis not present

## 2020-05-16 MED FILL — SUMATRIPTAN SUCCINATE 100 M: 100 | 30 days supply | Qty: 9 | Fill #1

## 2020-05-16 MED FILL — NORETHIND-ETH ESTRAD 1-0.02: 1-20 | 63 days supply | Qty: 63 | Fill #1

## 2020-05-16 MED FILL — AJOVY 225 MG/1.5ML SOAJ: 225 | 90 days supply | Qty: 5 | Fill #1

## 2020-06-01 NOTE — Progress Notes (Signed)
[id number]    Provider:  Dr Lucia Gaskins Requesting Provider: Bradd Canary, MD Primary Care Provider:  Bradd Canary, MD  CC:  Migraines  Interval history 06/01/2020: Patient with chronic migraines was started on Ajovy. Here for follow up. Baseline was 23 headache days out of the month, and 10 -12 moderately severe to severe migraines a month. Now 10 headache days which includes 5 migraine days. And imitrex helps acutely. Nurtec didn't help. She is extremely happy.  MRI brain 02/13/2020:   IMPRESSION: This MRI of the brain with and without contrast shows the following: 1.   The brain appears normal before and after contrast. 2.   Mild maxillary and ethmoid chronic sinusitis. 3.   Left mastoid effusion.  This could be due to eustachian tube dysfunction. 4.   There is a normal enhancement pattern no acute findings.  HPI:  Jean Phillips is a 29 y.o. female here as requested by Bradd Canary, MD for migraines. She has a PMHx of POTS, migraines. I reviewed Dr. Mariel Aloe notes: Patient was last seen for annual preventative exam, this was at the end of June of this year, no recent illnesses, she is feeling well, continue to stay active and maintaining a healthy diet, she discussed migraine headaches with Dr. Abner Greenspan who encouraged increased hydration, minimizing alcohol and caffeine, eating small frequent meals with lean proteins and complex carbs and avoiding high and low blood sugars, getting adequate sleep 78 hours a night, and exercising.  Physical examination was normal.  For her migraines it appears she is on Imitrex 100 mg and propranolol 10 mg.  I reviewed TSH, CMP and CBC which were all normal.  I do not see that patient had brain imaging in the notes I have available.  She has had migraines since she was a kid, she reports having them in the fourth grade and that is when they started to be migraines and not just headaches, as she is gotten older they have gotten more  frequent and more severe, she is tried multiple medications if is just a headache analgesics little help if it is a migraine over-the-counter meds will not work, she has had a headache since Friday.Patient's migraines start behind te eyes maybe unilateral but either side, behind her ears and the top of the neck, temples, pulsating/pounding and throbbing and sharper, nausea, light and sound sensitivity, they can last 24-72 hours, sumatriptan may help if she sleeps, she can wake up in the morning with headaches, vision changes and blurry vision, slowly progressive through the years. Over the last year it has worsened and she has 23 headache days out of the month, and 10 -12 moderately severe to severe migraines a month. No medication overuse. No aura. She has tried to narrow down triggers, better when she is exercing but she takes continuous birth control so not menstrual, she tried cutting out caffeine, at work they seem to be worse, blistering migraine severe, she tries to drink, she tried to eat well. No family history of migraines. No other focal neurologic deficits, associated symptoms, inciting events or modifiable factors.  From a thorough review of records, meds tried that can be used in migraine prevention include: Tylenol, propranolol, Imitrex, ibuprofen, labetalol, metoprolol, Zofran injections and tablets, Percocet, prednisone tablets, Phenergan tablets, rizatriptan,  Reviewed notes, labs and imaging from outside physicians, which showed: see above  Review of Systems: Patient complains of symptoms per HPI as well as the following symptoms: headache . Pertinent negatives and  positives per HPI. All others negative    Social History   Socioeconomic History  . Marital status: Married    Spouse name: Not on file  . Number of children: Not on file  . Years of education: Not on file  . Highest education level: Not on file  Occupational History  . Not on file  Tobacco Use  . Smoking status:  Former Games developermoker  . Smokeless tobacco: Never Used  Vaping Use  . Vaping Use: Never used  Substance and Sexual Activity  . Alcohol use: No  . Drug use: No  . Sexual activity: Yes  Other Topics Concern  . 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   Caffeine: 1 cup coffee in the morning and maybe once a week she has a coke.   Social Determinants of Health   Financial Resource Strain:   . Difficulty of Paying Living Expenses: Not on file  Food Insecurity:   . Worried About Programme researcher, broadcasting/film/videounning Out of Food in the Last Year: Not on file  . Ran Out of Food in the Last Year: Not on file  Transportation Needs:   . Lack of Transportation (Medical): Not on file  . Lack of Transportation (Non-Medical): Not on file  Physical Activity:   . Days of Exercise per Week: Not on file  . Minutes of Exercise per Session: Not on file  Stress:   . Feeling of Stress : Not on file  Social Connections:   . Frequency of Communication with Friends and Family: Not on file  . Frequency of Social Gatherings with Friends and Family: Not on file  . Attends Religious Services: Not on file  . Active Member of Clubs or Organizations: Not on file  . Attends BankerClub or Organization Meetings: Not on file  . Marital Status: Not on file  Intimate Partner Violence:   . Fear of Current or Ex-Partner: Not on file  . Emotionally Abused: Not on file  . Physically Abused: Not on file  . Sexually Abused: Not on file    Family History  Problem Relation Age of Onset  . Diabetes Maternal Grandmother   . Thyroid disease Maternal Grandmother        hypo, hyper  . Hypertension Maternal Grandmother   . Diabetes Maternal Grandfather   . Hypertension Maternal Grandfather   . Hypertension Father   . Heart disease Father        s/p MI in 2014, MI  . Alcohol abuse Father   . Dementia Father   . Diabetes Mother   . Obesity Mother        s/p gastric bypass  .  Hypertension Mother   . Hyperthyroidism Sister   . Thyroid disease Sister        hyper  . Migraines Neg Hx     Past Medical History:  Diagnosis Date  . Anemia   . Endometriosis   . Endometriosis   . Orthostatic hypotension   . POTS (postural orthostatic tachycardia syndrome)   . Subchorionic hematoma     Patient Active Problem List   Diagnosis Date Noted  . Sun-damaged skin 01/07/2020  . Low back pain 04/28/2018  . Migraine headache 07/08/2017  . Sinus tachycardia 06/18/2016  . Preventative health care 05/16/2016  . Hypoglycemia 05/10/2016  . POTS (postural orthostatic tachycardia syndrome)   . Endometriosis     Past Surgical History:  Procedure Laterality Date  . ENDOMETRIAL ABLATION    . LAPAROSCOPIC OVARIAN CYSTECTOMY      Current Outpatient Medications  Medication Sig Dispense Refill  . Fremanezumab-vfrm (AJOVY) 225 MG/1.5ML SOAJ Inject 225 mg into the skin every 30 (thirty) days. 4.5 mL 4  . Multiple Vitamin (MULTIVITAMIN PO) Take by mouth daily.    . norethindrone-ethinyl estradiol (LARIN 1/20) 1-20 MG-MCG tablet Take 1 tablet by mouth daily. 1 Package 11  . promethazine (PHENERGAN) 25 MG tablet TAKE 1/2-1 TABLETS (12.5-25 MG TOTAL) BY MOUTH EVERY 6 HOURS AS NEEDED FOR NAUSEA OR VOMITING. 20 tablet 1  . propranolol (INDERAL) 10 MG tablet Take 1 tablet (10 mg total) by mouth 2 (two) times daily. 180 tablet 3  . SUMAtriptan (IMITREX) 100 MG tablet Take 1 tablet (100 mg total) by mouth every 2 (two) hours as needed for migraine. May repeat in 2 hours if headache persists or recurs. 30 tablet 3   No current facility-administered medications for this visit.    Allergies as of 06/02/2020  . (No Known Allergies)    Vitals: BP 114/70   Pulse 74   Ht 5\' 3"  (1.6 m)   Wt 190 lb (86.2 kg)   BMI 33.66 kg/m  Last Weight:  Wt Readings from Last 1 Encounters:  06/02/20 190 lb (86.2 kg)   Last Height:   Ht Readings from Last 1 Encounters:  06/02/20 5\' 3"  (1.6 m)     Exam: NAD, pleasant                  Speech:    Speech is normal; fluent and spontaneous with normal comprehension.  Cognition:    The patient is oriented to person, place, and time;     recent and remote memory intact;     language fluent;    Cranial Nerves:    The pupils are equal, round, and reactive to light.Trigeminal sensation is intact and the muscles of mastication are normal. The face is symmetric. The palate elevates in the midline. Hearing intact. Voice is normal. Shoulder shrug is normal. The tongue has normal motion without fasciculations.   Coordination:  No dysmetria  Motor Observation:    No asymmetry, no atrophy, and no involuntary movements noted. Tone:    Normal muscle tone.     Strength:    Strength is V/V in the upper and lower limbs.      Sensation: intact to LT      Assessment/Plan:  29 year old with chronic migraines..Patient with chronic migraines was started on Ajovy. Here for follow up. Baseline was 23 headache days out of the month, and 10 -12 moderately severe to severe migraines a month. Now 10 headache days which includes 5 migraine days. And imitrex helps acutely. Nurtec didn't help. She is extremely happy.continue  MRI brain unremarkable, reviewed images with patient today  Preventative: Ajovy Acute management: Nurtec did not work, stay with Imitrex   No orders of the defined types were placed in this encounter.  Meds ordered this encounter  Medications  . Fremanezumab-vfrm (AJOVY) 225 MG/1.5ML SOAJ    Sig: Inject 225 mg into the skin every 30 (thirty) days.    Dispense:  4.5 mL    Refill:  4    Patient has copay card; she can have medication for $5 regardless of insurance approval or copay amount.  . SUMAtriptan (IMITREX) 100 MG tablet    Sig: Take 1 tablet (100 mg total) by mouth every 2 (two) hours  as needed for migraine. May repeat in 2 hours if headache persists or recurs.    Dispense:  30 tablet    Refill:  3    This is a 3  month supply.   Discussed: To prevent or relieve headaches, try the following: Cool Compress. Lie down and place a cool compress on your head.  Avoid headache triggers. If certain foods or odors seem to have triggered your migraines in the past, avoid them. A headache diary might help you identify triggers.  Include physical activity in your daily routine. Try a daily walk or other moderate aerobic exercise.  Manage stress. Find healthy ways to cope with the stressors, such as delegating tasks on your to-do list.  Practice relaxation techniques. Try deep breathing, yoga, massage and visualization.  Eat regularly. Eating regularly scheduled meals and maintaining a healthy diet might help prevent headaches. Also, drink plenty of fluids.  Follow a regular sleep schedule. Sleep deprivation might contribute to headaches Consider biofeedback. With this mind-body technique, you learn to control certain bodily functions -- such as muscle tension, heart rate and blood pressure -- to prevent headaches or reduce headache pain.    Proceed to emergency room if you experience new or worsening symptoms or symptoms do not resolve, if you have new neurologic symptoms or if headache is severe, or for any concerning symptom.   Provided education and documentation from American headache Society toolbox including articles on: chronic migraine medication overuse headache, chronic migraines, prevention of migraines, behavioral and other nonpharmacologic treatments for headache.  Cc: Bradd Canary, MD,  Bradd Canary, MD  Naomie Dean, MD  [facility] [address] [place], [place] [address]  I spent over 20  minutes of face-to-face and non-face-to-face time with patient on the  1. Chronic migraine without aura without status migrainosus, not intractable    diagnosis.  This included previsit chart review, lab review, study review, order entry, electronic health record  documentation, patient education on the different diagnostic and therapeutic options, counseling and coordination of care, risks and benefits of management, compliance, or risk factor reduction  Phone [phone] Fax [phone]

## 2020-06-02 ENCOUNTER — Encounter: Payer: Self-pay | Admitting: Neurology

## 2020-06-02 ENCOUNTER — Ambulatory Visit: Payer: 59 | Admitting: Neurology

## 2020-06-02 VITALS — BP 114/70 | HR 74 | Ht 63.0 in | Wt 190.0 lb

## 2020-06-02 DIAGNOSIS — G43709 Chronic migraine without aura, not intractable, without status migrainosus: Secondary | ICD-10-CM

## 2020-06-02 MED ORDER — AJOVY 225 MG/1.5ML ~~LOC~~ SOAJ: 225.0000 mg | SUBCUTANEOUS | 4 refills | Status: DC

## 2020-06-02 MED ORDER — SUMATRIPTAN SUCCINATE 100 MG PO TABS: 100.0000 mg | ORAL_TABLET | ORAL | 3 refills | Status: DC | PRN

## 2020-07-08 ENCOUNTER — Ambulatory Visit: Payer: 59 | Admitting: Physician Assistant

## 2020-07-08 ENCOUNTER — Encounter: Payer: Self-pay | Admitting: Physician Assistant

## 2020-07-18 MED FILL — PROPRANOLOL 10 MG TABLET: 10 | 90 days supply | Qty: 180 | Fill #2

## 2020-07-18 MED FILL — NORETHIND-ETH ESTRAD 1-0.02: 1-20 | 63 days supply | Qty: 63 | Fill #2

## 2020-09-02 MED FILL — AJOVY 225 MG/1.5ML SOAJ: 225 | 30 days supply | Qty: 2 | Fill #2

## 2020-10-02 MED FILL — AJOVY 225 MG/1.5ML SOAJ: 225 | 30 days supply | Qty: 2 | Fill #3

## 2020-10-09 ENCOUNTER — Other Ambulatory Visit (HOSPITAL_COMMUNITY): Payer: Self-pay

## 2020-10-10 ENCOUNTER — Other Ambulatory Visit (HOSPITAL_COMMUNITY): Payer: Self-pay

## 2020-10-18 ENCOUNTER — Other Ambulatory Visit (HOSPITAL_COMMUNITY): Payer: Self-pay

## 2020-10-18 MED FILL — Propranolol HCl Tab 10 MG: ORAL | 90 days supply | Qty: 180 | Fill #0 | Status: AC

## 2020-10-31 ENCOUNTER — Other Ambulatory Visit (HOSPITAL_COMMUNITY): Payer: Self-pay

## 2020-10-31 MED FILL — Fremanezumab-vfrm Subcutaneous Soln Auto-inj 225 MG/1.5ML: SUBCUTANEOUS | 28 days supply | Qty: 1.5 | Fill #0 | Status: CN

## 2020-11-01 ENCOUNTER — Other Ambulatory Visit (HOSPITAL_COMMUNITY): Payer: Self-pay

## 2020-11-03 ENCOUNTER — Other Ambulatory Visit (HOSPITAL_COMMUNITY): Payer: Self-pay

## 2020-11-04 ENCOUNTER — Other Ambulatory Visit (HOSPITAL_COMMUNITY): Payer: Self-pay

## 2020-11-04 ENCOUNTER — Telehealth: Payer: Self-pay | Admitting: Neurology

## 2020-11-04 NOTE — Telephone Encounter (Signed)
PA submitted through [employer] [id number] Should hear a response within 24-48 hrs

## 2020-11-06 ENCOUNTER — Other Ambulatory Visit (HOSPITAL_COMMUNITY): Payer: Self-pay

## 2020-11-06 NOTE — Telephone Encounter (Signed)
Received a determination from [employer].  The patient must try TWO of the following: Aimovig, Emgality, Nurtec, or Qulipta.  Patient has tried Nurtec only, needs to try another medication before Ajovy can be approved.  Patient may be able to use the savings card but unsure for how long and if she has a high deductible plan she may not be able to use it more than a couple of times.  Will notify patient and if she is unable to use the car and discussed with Dr. Lucia Gaskins the possibility of switching to another medication that is preferred.  If we choose to appeal this denial, fax within 180 days to [phone].

## 2020-11-07 ENCOUNTER — Other Ambulatory Visit (HOSPITAL_COMMUNITY): Payer: Self-pay

## 2020-11-11 ENCOUNTER — Other Ambulatory Visit (HOSPITAL_COMMUNITY): Payer: Self-pay

## 2020-11-18 ENCOUNTER — Other Ambulatory Visit (HOSPITAL_COMMUNITY): Payer: Self-pay

## 2020-11-22 ENCOUNTER — Other Ambulatory Visit (HOSPITAL_COMMUNITY): Payer: Self-pay

## 2020-11-22 MED FILL — Norethindrone Ace & Ethinyl Estradiol Tab 1 MG-20 MCG: ORAL | 63 days supply | Qty: 63 | Fill #0 | Status: AC

## 2020-11-24 ENCOUNTER — Other Ambulatory Visit (HOSPITAL_COMMUNITY): Payer: Self-pay

## 2020-11-24 ENCOUNTER — Telehealth: Payer: Self-pay | Admitting: *Deleted

## 2020-11-24 MED ORDER — EMGALITY 120 MG/ML ~~LOC~~ SOAJ
120.0000 mg | SUBCUTANEOUS | 5 refills | Status: DC
  Filled 2020-11-24 – 2021-01-06 (×3): qty 1, 30d supply, fill #0
  Filled 2021-04-07: qty 1, 30d supply, fill #1
  Filled 2021-09-04: qty 1, 28d supply, fill #2
  Filled 2021-11-09: qty 1, 30d supply, fill #2

## 2020-11-24 NOTE — Telephone Encounter (Signed)
Per Dr Lucia Gaskins, switch to [facility].

## 2020-11-24 NOTE — Addendum Note (Signed)
Addended by: Bertram Savin on: 11/24/2020 11:47 AM   Modules accepted: Orders

## 2020-11-24 NOTE — Telephone Encounter (Signed)
Completed Emgality PA on Cover My Meds. Key: [id number]. Awaiting determination from [employer].

## 2020-11-25 ENCOUNTER — Encounter: Payer: Self-pay | Admitting: Family Medicine

## 2020-11-25 ENCOUNTER — Other Ambulatory Visit (HOSPITAL_COMMUNITY): Payer: Self-pay

## 2020-11-26 ENCOUNTER — Other Ambulatory Visit (HOSPITAL_COMMUNITY): Payer: Self-pay

## 2020-11-26 ENCOUNTER — Telehealth: Payer: Self-pay

## 2020-11-26 DIAGNOSIS — Z111 Encounter for screening for respiratory tuberculosis: Secondary | ICD-10-CM

## 2020-11-26 NOTE — Telephone Encounter (Signed)
Called pt to get her set up for lab appointment for TB screening.

## 2020-11-26 NOTE — Telephone Encounter (Signed)
The request has been approved. The authorization is effective for a maximum of 5 fills from 12/19/2020 to 05/20/2021, as long as the member is enrolled in their current health plan. The request was approved as submitted. This request has been approved for 11mL per 30 days.An additional authorization has been entered for Emgality 120mg /mL pen allowing a maximum of 1 fill with a quantity limit of 36mL per 30 days effective 11/26/2020 through 12/26/2020; please reference authorization [id number]. A written notification letter will follow with additional details.  Faxed letter to pt's pharmacy. Received a receipt of confirmation.

## 2020-11-27 ENCOUNTER — Other Ambulatory Visit: Payer: Self-pay | Admitting: *Deleted

## 2020-11-28 ENCOUNTER — Other Ambulatory Visit: Payer: Self-pay

## 2020-11-28 ENCOUNTER — Other Ambulatory Visit (INDEPENDENT_AMBULATORY_CARE_PROVIDER_SITE_OTHER): Payer: No Typology Code available for payment source

## 2020-11-28 DIAGNOSIS — Z111 Encounter for screening for respiratory tuberculosis: Secondary | ICD-10-CM | POA: Diagnosis not present

## 2020-12-01 LAB — QUANTIFERON-TB GOLD PLUS
Mitogen-NIL: >10 IU/mL
NIL: 0.02 IU/mL
QuantiFERON-TB Gold Plus: NEGATIVE
TB1-NIL: 0.04 IU/mL
TB2-NIL: 0.01 IU/mL

## 2020-12-06 ENCOUNTER — Other Ambulatory Visit (HOSPITAL_COMMUNITY): Payer: Self-pay

## 2020-12-19 ENCOUNTER — Telehealth: Payer: No Typology Code available for payment source | Admitting: Physician Assistant

## 2020-12-19 ENCOUNTER — Other Ambulatory Visit (HOSPITAL_COMMUNITY): Payer: Self-pay

## 2020-12-19 DIAGNOSIS — B9689 Other specified bacterial agents as the cause of diseases classified elsewhere: Secondary | ICD-10-CM | POA: Diagnosis not present

## 2020-12-19 DIAGNOSIS — J208 Acute bronchitis due to other specified organisms: Secondary | ICD-10-CM | POA: Diagnosis not present

## 2020-12-19 MED ORDER — AZITHROMYCIN 250 MG PO TABS
ORAL_TABLET | ORAL | 0 refills | Status: AC
  Filled 2020-12-19: qty 6, 5d supply, fill #0

## 2020-12-19 NOTE — Progress Notes (Signed)
I have spent 5 minutes in review of e-visit questionnaire, review and updating patient chart, medical decision making and response to patient.   Chisum Habenicht Cody Cooper Moroney, PA-C    

## 2020-12-19 NOTE — Progress Notes (Signed)
We are sorry that you are not feeling well.  Here is how we plan to help!  Based on your presentation I believe you most likely have A cough due to bacteria.  When patients have a fever and a productive cough with a change in color or increased sputum production, we are concerned about bacterial bronchitis.  If left untreated it can progress to pneumonia.  If your symptoms do not improve with your treatment plan it is important that you contact your provider.   I have prescribed Azithromyin 250 mg: two tablets now and then one tablet daily for 4 additonal days    In addition you may use A non-prescription cough medication called Robitussin DAC. Take 2 teaspoons every 8 hours or Delsym: take 2 teaspoons every 12 hours.    From your responses in the eVisit questionnaire you describe inflammation in the upper respiratory tract which is causing a significant cough.  This is commonly called Bronchitis and has four common causes:    Allergies  Viral Infections  Acid Reflux  Bacterial Infection Allergies, viruses and acid reflux are treated by controlling symptoms or eliminating the cause. An example might be a cough caused by taking certain blood pressure medications. You stop the cough by changing the medication. Another example might be a cough caused by acid reflux. Controlling the reflux helps control the cough.  USE OF BRONCHODILATOR ("RESCUE") INHALERS: There is a risk from using your bronchodilator too frequently.  The risk is that over-reliance on a medication which only relaxes the muscles surrounding the breathing tubes can reduce the effectiveness of medications prescribed to reduce swelling and congestion of the tubes themselves.  Although you feel brief relief from the bronchodilator inhaler, your asthma may actually be worsening with the tubes becoming more swollen and filled with mucus.  This can delay other crucial treatments, such as oral steroid medications. If you need to use a  bronchodilator inhaler daily, several times per day, you should discuss this with your provider.  There are probably better treatments that could be used to keep your asthma under control.     HOME CARE . Only take medications as instructed by your medical team. . Complete the entire course of an antibiotic. . Drink plenty of fluids and get plenty of rest. . Avoid close contacts especially the very young and the elderly . Cover your mouth if you cough or cough into your sleeve. . Always remember to wash your hands . A steam or ultrasonic humidifier can help congestion.   GET HELP RIGHT AWAY IF: . You develop worsening fever. . You become short of breath . You cough up blood. . Your symptoms persist after you have completed your treatment plan MAKE SURE YOU   Understand these instructions.  Will watch your condition.  Will get help right away if you are not doing well or get worse.  Your e-visit answers were reviewed by a board certified advanced clinical practitioner to complete your personal care plan.  Depending on the condition, your plan could have included both over the counter or prescription medications. If there is a problem please reply  once you have received a response from your provider. Your safety is important to us.  If you have drug allergies check your prescription carefully.    You can use MyChart to ask questions about today's visit, request a non-urgent call back, or ask for a work or school excuse for 24 hours related to this e-Visit. If it has   been greater than 24 hours you will need to follow up with your provider, or enter a new e-Visit to address those concerns. You will get an e-mail in the next two days asking about your experience.  I hope that your e-visit has been valuable and will speed your recovery. Thank you for using e-visits.   

## 2020-12-30 ENCOUNTER — Other Ambulatory Visit (HOSPITAL_COMMUNITY): Payer: Self-pay

## 2021-01-06 ENCOUNTER — Other Ambulatory Visit: Payer: Self-pay

## 2021-01-06 ENCOUNTER — Ambulatory Visit (INDEPENDENT_AMBULATORY_CARE_PROVIDER_SITE_OTHER): Payer: No Typology Code available for payment source | Admitting: Family Medicine

## 2021-01-06 ENCOUNTER — Other Ambulatory Visit (HOSPITAL_COMMUNITY): Payer: Self-pay

## 2021-01-06 VITALS — BP 116/72 | HR 98 | Temp 98.3°F | Resp 16 | Ht 63.0 in | Wt 205.8 lb

## 2021-01-06 DIAGNOSIS — F418 Other specified anxiety disorders: Secondary | ICD-10-CM | POA: Insufficient documentation

## 2021-01-06 DIAGNOSIS — J069 Acute upper respiratory infection, unspecified: Secondary | ICD-10-CM | POA: Diagnosis not present

## 2021-01-06 DIAGNOSIS — G4700 Insomnia, unspecified: Secondary | ICD-10-CM

## 2021-01-06 DIAGNOSIS — L509 Urticaria, unspecified: Secondary | ICD-10-CM | POA: Diagnosis not present

## 2021-01-06 DIAGNOSIS — G43909 Migraine, unspecified, not intractable, without status migrainosus: Secondary | ICD-10-CM | POA: Diagnosis not present

## 2021-01-06 DIAGNOSIS — N809 Endometriosis, unspecified: Secondary | ICD-10-CM

## 2021-01-06 DIAGNOSIS — R000 Tachycardia, unspecified: Secondary | ICD-10-CM

## 2021-01-06 DIAGNOSIS — Z6836 Body mass index (BMI) 36.0-36.9, adult: Secondary | ICD-10-CM

## 2021-01-06 DIAGNOSIS — Z0000 Encounter for general adult medical examination without abnormal findings: Secondary | ICD-10-CM

## 2021-01-06 DIAGNOSIS — E669 Obesity, unspecified: Secondary | ICD-10-CM

## 2021-01-06 LAB — COMPREHENSIVE METABOLIC PANEL
ALT: 23 U/L (ref 0–35)
AST: 22 U/L (ref 0–37)
Albumin: 4.1 g/dL (ref 3.5–5.2)
Alkaline Phosphatase: 56 U/L (ref 39–117)
BUN: 10 mg/dL (ref 6–23)
CO2: 27 mEq/L (ref 19–32)
Calcium: 9.1 mg/dL (ref 8.4–10.5)
Chloride: 106 mEq/L (ref 96–112)
Creatinine, Ser: 0.8 mg/dL (ref 0.40–1.20)
GFR: 100.36 mL/min (ref >60.00)
Glucose, Bld: 87 mg/dL (ref 70–99)
Potassium: 4.7 mEq/L (ref 3.5–5.1)
Sodium: 139 mEq/L (ref 135–145)
Total Bilirubin: 0.4 mg/dL (ref 0.2–1.2)
Total Protein: 6.8 g/dL (ref 6.0–8.3)

## 2021-01-06 LAB — CBC WITH DIFFERENTIAL/PLATELET
Basophils Absolute: 0 10*3/uL (ref 0.0–0.1)
Basophils Relative: 0.6 % (ref 0.0–3.0)
Eosinophils Absolute: 0.2 10*3/uL (ref 0.0–0.7)
Eosinophils Relative: 4.8 % (ref 0.0–5.0)
HCT: 40.3 % (ref 36.0–46.0)
Hemoglobin: 13.6 g/dL (ref 12.0–15.0)
Lymphocytes Relative: 40 % (ref 12.0–46.0)
Lymphs Abs: 2 10*3/uL (ref 0.7–4.0)
MCHC: 33.7 g/dL (ref 30.0–36.0)
MCV: 87.7 fl (ref 78.0–100.0)
Monocytes Absolute: 0.8 10*3/uL (ref 0.1–1.0)
Monocytes Relative: 16.9 % — ABNORMAL HIGH (ref 3.0–12.0)
Neutro Abs: 1.9 10*3/uL (ref 1.4–7.7)
Neutrophils Relative %: 37.7 % — ABNORMAL LOW (ref 43.0–77.0)
Platelets: 316 10*3/uL (ref 150.0–400.0)
RBC: 4.6 Mil/uL (ref 3.87–5.11)
RDW: 13.4 % (ref 11.5–15.5)
WBC: 4.9 10*3/uL (ref 4.0–10.5)

## 2021-01-06 LAB — LIPID PANEL
Cholesterol: 207 mg/dL — ABNORMAL HIGH (ref 0–200)
HDL: 49.8 mg/dL (ref >39.00)
LDL Cholesterol: 130 mg/dL — ABNORMAL HIGH (ref 0–99)
NonHDL: 156.83
Total CHOL/HDL Ratio: 4
Triglycerides: 134 mg/dL (ref 0.0–149.0)
VLDL: 26.8 mg/dL (ref 0.0–40.0)

## 2021-01-06 LAB — TSH: TSH: 2.01 u[IU]/mL (ref 0.35–5.50)

## 2021-01-06 MED ORDER — FLUOXETINE HCL 20 MG PO TABS
20.0000 mg | ORAL_TABLET | Freq: Every day | ORAL | 1 refills | Status: DC
  Filled 2021-01-06: qty 90, 90d supply, fill #0
  Filled 2021-04-07: qty 90, 90d supply, fill #1

## 2021-01-06 MED ORDER — PROPRANOLOL HCL 10 MG PO TABS
ORAL_TABLET | Freq: Two times a day (BID) | ORAL | 3 refills | Status: DC
  Filled 2021-01-06: qty 180, 90d supply, fill #0
  Filled 2021-04-07: qty 180, 90d supply, fill #1

## 2021-01-06 NOTE — Assessment & Plan Note (Addendum)
Following with Dr Lucia Gaskins of [facility] has not started. Ejovy worked well but insurance will not cover, was not having HAs then now a couple times a week. Will try [employer]

## 2021-01-06 NOTE — Assessment & Plan Note (Signed)
Depression worsening with the stress of the past 2 years. Agrees to try Fluoxetine 10 mg daily x 1 week then increase to 20 mg daily. Warned regarding side effects and can consider counseling as well.

## 2021-01-06 NOTE — Assessment & Plan Note (Signed)
Referred back to cardiology Dr Melburn Popper whom she has not seen in years. Still has episodes on the Propranolol up to 140s at work and 200 with exercise. Recovers with rest

## 2021-01-06 NOTE — Assessment & Plan Note (Signed)
Encouraged good sleep hygiene such as dark, quiet room. No blue/green glowing lights such as computer screens in bedroom. No alcohol or stimulants in evening. Cut down on caffeine as able. Regular exercise is helpful but not just prior to bed time. See AVS for other suggested treatments

## 2021-01-06 NOTE — Assessment & Plan Note (Addendum)
Chest congestion, HA, N, PND, minor head congestion, no f/c. Noted bodyaches, fatigue. Cough is better. Took a zpak and that helped for a period but is worsening. Encouraged increased rest and hydration, add probiotics, zinc such as Coldeze or Xicam. Treat fevers as needed

## 2021-01-06 NOTE — Assessment & Plan Note (Signed)
Encouraged DASH or MIND diet, decrease po intake and increase exercise as tolerated. Needs 7-8 hours of sleep nightly. Avoid trans fats, eat small, frequent meals every 4-5 hours with lean proteins, complex carbs and healthy fats. Minimize simple carbs, high fat foods and processed foods. Referred to [facility]

## 2021-01-06 NOTE — Assessment & Plan Note (Addendum)
Patient encouraged to maintain heart healthy diet, regular exercise, adequate sleep. Consider daily probiotics. Take medications as prescribed. Labs ordered. Follows with ob/gyn for pap smears.

## 2021-01-06 NOTE — Patient Instructions (Signed)
Encouraged good sleep hygiene such as dark, quiet room. No blue/green glowing lights such as computer screens in bedroom. No alcohol or stimulants in evening. Cut down on caffeine as able. Regular exercise is helpful but not just prior to bed time.   [employer] makes a sleep supplement called Sleep +  or MindBodyGreen called Sleep support   Magnesium Glycinate 200-400 mg at bed with NOW +/-  L Tryptophan +/- w/MBG  Preventive Care 34-28 Years Old, Female Preventive care refers to lifestyle choices and visits with your health care provider that can promote health and wellness. This includes: A yearly physical exam. This is also called an annual wellness visit. Regular dental and eye exams. Immunizations. Screening for certain conditions. Healthy lifestyle choices, such as: Eating a healthy diet. Getting regular exercise. Not using drugs or products that contain nicotine and tobacco. Limiting alcohol use. What can I expect for my preventive care visit? Physical exam Your health care provider may check your: Height and weight. These may be used to calculate your BMI (body mass index). BMI is a measurement that tells if you are at a healthy weight. Heart rate and blood pressure. Body temperature. Skin for abnormal spots. Counseling Your health care provider may ask you questions about your: Past medical problems. Family's medical history. Alcohol, tobacco, and drug use. Emotional well-being. Home life and relationship well-being. Sexual activity. Diet, exercise, and sleep habits. Work and work Statistician. Access to firearms. Method of birth control. Menstrual cycle. Pregnancy history. What immunizations do I need?  Vaccines are usually given at various ages, according to a schedule. Your health care provider will recommend vaccines for you based on your age, medicalhistory, and lifestyle or other factors, such as travel or where you work. What tests do I need?  Blood  tests Lipid and cholesterol levels. These may be checked every 5 years starting at age 77. Hepatitis C test. Hepatitis B test. Screening Diabetes screening. This is done by checking your blood sugar (glucose) after you have not eaten for a while (fasting). STD (sexually transmitted disease) testing, if you are at risk. BRCA-related cancer screening. This may be done if you have a family history of breast, ovarian, tubal, or peritoneal cancers. Pelvic exam and Pap test. This may be done every 3 years starting at age 7. Starting at age 70, this may be done every 5 years if you have a Pap test in combination with an HPV test. Talk with your health care provider about your test results, treatment options,and if necessary, the need for more tests. Follow these instructions at home: Eating and drinking  Eat a healthy diet that includes fresh fruits and vegetables, whole grains, lean protein, and low-fat dairy products. Take vitamin and mineral supplements as recommended by your health care provider. Do not drink alcohol if: Your health care provider tells you not to drink. You are pregnant, may be pregnant, or are planning to become pregnant. If you drink alcohol: Limit how much you have to 0-1 drink a day. Be aware of how much alcohol is in your drink. In the [place] one drink equals one 12 oz bottle of beer (355 mL), one 5 oz glass of wine (148 mL), or one 1 oz glass of hard liquor (44 mL).  Lifestyle Take daily care of your teeth and gums. Brush your teeth every morning and night with fluoride toothpaste. Floss one time each day. Stay active. Exercise for at least 30 minutes 5 or more days each week. Do  not use any products that contain nicotine or tobacco, such as cigarettes, e-cigarettes, and chewing tobacco. If you need help quitting, ask your health care provider. Do not use drugs. If you are sexually active, practice safe sex. Use a condom or other form of protection to prevent STIs  (sexually transmitted infections). If you do not wish to become pregnant, use a form of birth control. If you plan to become pregnant, see your health care provider for a prepregnancy visit. Find healthy ways to cope with stress, such as: Meditation, yoga, or listening to music. Journaling. Talking to a trusted person. Spending time with friends and family. Safety Always wear your seat belt while driving or riding in a vehicle. Do not drive: If you have been drinking alcohol. Do not ride with someone who has been drinking. When you are tired or distracted. While texting. Wear a helmet and other protective equipment during sports activities. If you have firearms in your house, make sure you follow all gun safety procedures. Seek help if you have been physically or sexually abused. What's next? Go to your health care provider once a year for an annual wellness visit. Ask your health care provider how often you should have your eyes and teeth checked. Stay up to date on all vaccines. This information is not intended to replace advice given to you by your health care provider. Make sure you discuss any questions you have with your healthcare provider. Document Revised: 02/17/2020 Document Reviewed: 03/02/2018 Elsevier Patient Education  2022 [employer].

## 2021-01-06 NOTE — Progress Notes (Signed)
Patient ID: Jean Phillips, female    DOB: Nov 30, 1990  Age: 30 y.o. MRN: [id number]    Subjective:  Subjective  HPI Chaye Misch presents for office visit today for follow up on POTS and migraines. She denies having any panic attacks, but endorses dealing with feeling more anxious. She endorses feeling fatigue and having a FMHx of depression. She states that when she was younger she has had issues with falling asleep. She states that she  Issues falling asleep, tried melatonin for a while and it did not work for her. She denies any choking during sleep. She denies CP/SOB/fevers or GU c/o. Taking meds as prescribed. She states that when she has had a COVID-19 infection, she experienced dizziness.  She experiences episodes of tachycardia. Goes up to 140 BPM at work and when working out 240 BPM and feels palpitations.  She reports that she has had a recent respiratory infection. She endorses feeling congestion, post nasal drip, nausea, body aches, and fatigue. She denies any fevers or chills. She states that it was not a COVID-19 infection. She states that she has had a cough as well, but it has subsided.  She has had episodes of rashes and hives breaking out that happen at random weekly. She state that it could be from anything she touches and nothing specific to state. She states that the breaking out comes and goes quickly, but recently it has been more frequent.   Review of Systems  Constitutional:  Positive for fatigue. Negative for chills and fever.       (+) body aches  HENT:  Positive for postnasal drip. Negative for congestion, rhinorrhea, sinus pressure, sinus pain and sore throat.   Eyes:  Negative for pain.  Respiratory:  Negative for cough and shortness of breath.   Cardiovascular:  Positive for palpitations. Negative for chest pain and leg swelling.  Gastrointestinal:  Positive for nausea (due to respiratory infection). Negative for abdominal pain, blood in stool, diarrhea and  vomiting.  Genitourinary:  Negative for decreased urine volume, flank pain, frequency, vaginal bleeding and vaginal discharge.  Musculoskeletal:  Negative for back pain.  Neurological:  Positive for headaches (migraines).  Psychiatric/Behavioral:  Positive for sleep disturbance (falling asleep).        (+) depression   History Past Medical History:  Diagnosis Date   Anemia    Endometriosis    Endometriosis    Orthostatic hypotension    POTS (postural orthostatic tachycardia syndrome)    Subchorionic hematoma     She has a past surgical history that includes Laparoscopic ovarian cystectomy and Endometrial ablation.   Her family history includes Alcohol abuse in her father; Dementia in her father; Diabetes in her maternal grandfather, maternal grandmother, and mother; Heart disease in her father; Hypertension in her father, maternal grandfather, maternal grandmother, and mother; Hyperthyroidism in her sister; Obesity in her mother; Thyroid disease in her maternal grandmother and sister.She reports that she has quit smoking. She has never used smokeless tobacco. She reports that she does not drink alcohol and does not use drugs.  Current Outpatient Medications on File Prior to Visit  Medication Sig Dispense Refill   Galcanezumab-gnlm (EMGALITY) 120 MG/ML SOAJ Inject 120 mg into the skin every 30 days. 1 mL 5   Multiple Vitamin (MULTIVITAMIN PO) Take by mouth daily.     norethindrone-ethinyl estradiol (LOESTRIN) 1-20 MG-MCG tablet Take 1 tablet by mouth daily. 1 Package 11   norethindrone-ethinyl estradiol (LOESTRIN) 1-20 MG-MCG tablet TAKE 1 TABLET BY MOUTH  ONCE DAILY (TAKE CONTINUOUSLY) 63 tablet 4   promethazine (PHENERGAN) 25 MG tablet TAKE 1/2-1 TABLETS (12.5-25 MG TOTAL) BY MOUTH EVERY 6 HOURS AS NEEDED FOR NAUSEA OR VOMITING. 20 tablet 1   SUMAtriptan (IMITREX) 100 MG tablet Take 1 tablet (100 mg total) by mouth every 2 (two) hours as needed for migraine. May repeat in 2 hours if  headache persists or recurs. 30 tablet 3   No current facility-administered medications on file prior to visit.     Objective:  Objective  Physical Exam Constitutional:      General: She is not in acute distress.    Appearance: Normal appearance. She is not ill-appearing or toxic-appearing.  HENT:     Head: Normocephalic and atraumatic.     Right Ear: Tympanic membrane, ear canal and external ear normal.     Left Ear: Tympanic membrane, ear canal and external ear normal.     Nose: No congestion or rhinorrhea.  Eyes:     Extraocular Movements: Extraocular movements intact.     Pupils: Pupils are equal, round, and reactive to light.  Cardiovascular:     Rate and Rhythm: Normal rate and regular rhythm.     Pulses: Normal pulses.     Heart sounds: Normal heart sounds. No murmur heard. Pulmonary:     Effort: Pulmonary effort is normal. No respiratory distress.     Breath sounds: Normal breath sounds. No wheezing, rhonchi or rales.  Abdominal:     General: Bowel sounds are normal.     Palpations: Abdomen is soft. There is no mass.     Tenderness: no abdominal tenderness There is no guarding.     Hernia: No hernia is present.  Musculoskeletal:        General: Normal range of motion.     Cervical back: Normal range of motion and neck supple.  Skin:    General: Skin is warm and dry.  Neurological:     Mental Status: She is alert and oriented to person, place, and time.     Cranial Nerves: No facial asymmetry.     Motor: Motor function is intact. No weakness.  Psychiatric:        Behavior: Behavior normal.   BP 116/72   Pulse 98   Temp 98.3 F (36.8 C)   Resp 16   Ht 5\' 3"  (1.6 m)   Wt 205 lb 12.8 oz (93.4 kg)   SpO2 95%   BMI 36.46 kg/m  Wt Readings from Last 3 Encounters:  01/06/21 205 lb 12.8 oz (93.4 kg)  06/02/20 190 lb (86.2 kg)  01/28/20 187 lb (84.8 kg)     Lab Results  Component Value Date   WBC 6.1 01/01/2020   HGB 13.7 01/01/2020   HCT 40.9 01/01/2020    PLT 331.0 01/01/2020   GLUCOSE 83 01/01/2020   CHOL 183 05/10/2016   TRIG 152.0 (H) 05/10/2016   HDL 56.00 05/10/2016   LDLCALC 97 05/10/2016   ALT 13 01/01/2020   AST 13 01/01/2020   NA 137 01/01/2020   K 4.3 01/01/2020   CL 102 01/01/2020   CREATININE 0.76 01/01/2020   BUN 15 01/01/2020   CO2 28 01/01/2020   TSH 1.22 01/01/2020   HGBA1C 5.1 05/10/2016    No results found.   Assessment & Plan:  Plan    Meds ordered this encounter  Medications   FLUoxetine (PROZAC) 20 MG tablet    Sig: Take 1 tablet (20 mg total) by mouth daily.  Dispense:  90 tablet    Refill:  1   propranolol (INDERAL) 10 MG tablet    Sig: TAKE 1 TABLET BY MOUTH TWICE DAILY    Dispense:  180 tablet    Refill:  3     Problem List Items Addressed This Visit     Endometriosis    Philip Aspen, MD is the GYN who manages       Preventative health care - Primary    Patient encouraged to maintain heart healthy diet, regular exercise, adequate sleep. Consider daily probiotics. Take medications as prescribed. Labs ordered. Follows with ob/gyn for pap smears.        Relevant Orders   CBC with Differential/Platelet   Comprehensive metabolic panel   TSH   Lipid panel   Sinus tachycardia    Referred back to cardiology Dr Melburn Popper whom she has not seen in years. Still has episodes on the Propranolol up to 140s at work and 200 with exercise. Recovers with rest       Relevant Orders   Ambulatory referral to Cardiology   Migraine headache    Following with Dr Lucia Gaskins of [facility] has not started. Ejovy worked well but insurance will not cover, was not having HAs then now a couple times a week. Will try Emgality       Relevant Medications   FLUoxetine (PROZAC) 20 MG tablet   propranolol (INDERAL) 10 MG tablet   URI (upper respiratory infection)    Chest congestion, HA, N, PND, minor head congestion, no f/c. Noted bodyaches, fatigue. Cough is better. Took a zpak and that helped for a period but is  worsening. Encouraged increased rest and hydration, add probiotics, zinc such as Coldeze or Xicam. Treat fevers as needed       Urticaria    Randomly occurring, weekly no obvious single source. Try Zyrtec bid as needed and let us know if worsens.       Class 2 obesity with body mass index (BMI) of 36.0 to 36.9 in adult    Encouraged DASH or MIND diet, decrease po intake and increase exercise as tolerated. Needs 7-8 hours of sleep nightly. Avoid trans fats, eat small, frequent meals every 4-5 hours with lean proteins, complex carbs and healthy fats. Minimize simple carbs, high fat foods and processed foods. Referred to [facility]       Relevant Orders   Amb Ref to Medical Weight Management   Insomnia    Encouraged good sleep hygiene such as dark, quiet room. No blue/green glowing lights such as computer screens in bedroom. No alcohol or stimulants in evening. Cut down on caffeine as able. Regular exercise is helpful but not just prior to bed time. See AVS for other suggested treatments       Depression with anxiety    Depression worsening with the stress of the past 2 years. Agrees to try Fluoxetine 10 mg daily x 1 week then increase to 20 mg daily. Warned regarding side effects and can consider counseling as well.        Relevant Medications   FLUoxetine (PROZAC) 20 MG tablet    Follow-up: Return in about 3 months (around 04/08/2021), or VV f/u med changes.  I, Billie Lade, acting as a scribe for Danise Edge, MD, have documented all relevent documentation on behalf of Danise Edge, MD, as directed by Danise Edge, MD while in the presence of Danise Edge, MD.  I, Bradd Canary, MD personally performed the services described in this  documentation. All medical record entries made by the scribe were at my direction and in my presence. I have reviewed the chart and agree that the record reflects my personal performance and is accurate and complete

## 2021-01-06 NOTE — Assessment & Plan Note (Addendum)
Randomly occurring, weekly no obvious single source. Try Zyrtec bid as needed and let us know if worsens.

## 2021-01-06 NOTE — Assessment & Plan Note (Signed)
Jean Aspen, MD is the GYN who manages

## 2021-01-08 ENCOUNTER — Other Ambulatory Visit (HOSPITAL_COMMUNITY): Payer: Self-pay

## 2021-01-24 ENCOUNTER — Other Ambulatory Visit: Payer: Self-pay

## 2021-01-26 ENCOUNTER — Other Ambulatory Visit (HOSPITAL_COMMUNITY): Payer: Self-pay

## 2021-01-26 MED ORDER — NORETHINDRONE ACET-ETHINYL EST 1-20 MG-MCG PO TABS
ORAL_TABLET | ORAL | 1 refills | Status: DC
  Filled 2021-01-26: qty 21, 21d supply, fill #0
  Filled 2021-04-07: qty 21, 21d supply, fill #1

## 2021-03-11 ENCOUNTER — Encounter (INDEPENDENT_AMBULATORY_CARE_PROVIDER_SITE_OTHER): Payer: Self-pay

## 2021-03-16 ENCOUNTER — Other Ambulatory Visit (HOSPITAL_COMMUNITY): Payer: Self-pay

## 2021-03-16 ENCOUNTER — Encounter: Payer: Self-pay | Admitting: *Deleted

## 2021-03-16 LAB — HM DEXA SCAN: HM Dexa Scan: NORMAL

## 2021-03-16 MED ORDER — LEVONORGESTREL-ETHINYL ESTRAD 0.1-20 MG-MCG PO TABS
ORAL_TABLET | ORAL | 3 refills | Status: DC
  Filled 2021-03-16: qty 84, 63d supply, fill #0

## 2021-03-24 ENCOUNTER — Other Ambulatory Visit (HOSPITAL_COMMUNITY): Payer: Self-pay

## 2021-03-31 ENCOUNTER — Encounter: Payer: Self-pay | Admitting: Cardiology

## 2021-03-31 ENCOUNTER — Other Ambulatory Visit: Payer: Self-pay

## 2021-03-31 ENCOUNTER — Ambulatory Visit (INDEPENDENT_AMBULATORY_CARE_PROVIDER_SITE_OTHER): Payer: No Typology Code available for payment source | Admitting: Cardiology

## 2021-03-31 VITALS — BP 104/70 | HR 55 | Ht 64.0 in | Wt 196.2 lb

## 2021-03-31 DIAGNOSIS — G90A Postural orthostatic tachycardia syndrome (POTS): Secondary | ICD-10-CM

## 2021-03-31 DIAGNOSIS — I498 Other specified cardiac arrhythmias: Secondary | ICD-10-CM | POA: Diagnosis not present

## 2021-03-31 DIAGNOSIS — R001 Bradycardia, unspecified: Secondary | ICD-10-CM | POA: Diagnosis not present

## 2021-03-31 NOTE — Patient Instructions (Signed)
Medication Instructions:  Your physician recommends that you continue on your current medications as directed. Please refer to the Current Medication list given to you today.  *If you need a refill on your cardiac medications before your next appointment, please call your pharmacy*   Lab Work: TODAY: urine sodium and osmolality If you have labs (blood work) drawn today and your tests are completely normal, you will receive your results only by: MyChart Message (if you have MyChart) OR A paper copy in the mail If you have any lab test that is abnormal or we need to change your treatment, we will call you to review the results.   Testing/Procedures: Your physician has requested that you have an echocardiogram. Echocardiography is a painless test that uses sound waves to create images of your heart. It provides your doctor with information about the size and shape of your heart and how well your heart's chambers and valves are working. This procedure takes approximately one hour. There are no restrictions for this procedure.  Follow-Up: At [facility], you and your health needs are our priority.  As part of our continuing mission to provide you with exceptional heart care, we have created designated Provider Care Teams.  These Care Teams include your primary Cardiologist (physician) and Advanced Practice Providers (APPs -  Physician Assistants and Nurse Practitioners) who all work together to provide you with the care you need, when you need it.  Your next appointment:   2-3 month(s)  The format for your next appointment:   In Person  Provider:   You may see Armanda Magic, MD or one of the following Advanced Practice Providers on your designated Care Team:   Ronie Spies, PA-C Jacolyn Reedy, PA-C

## 2021-03-31 NOTE — Progress Notes (Addendum)
Cardiology CONSULT Note    Date:  03/31/2021   ID:  Jean Phillips, DOB 08/26/1990, MRN [id number]  PCP:  Bradd Canary, MD  Cardiologist:  Armanda Magic, MD   Chief Complaint  Patient presents with   New Patient (Initial Visit)    POTS     History of Present Illness:  Jean Phillips is a 30 y.o. female who is being seen today for the evaluation of sinus tachycardia at the request of Bradd Canary, MD.  This is a 30yo female with a hx of POTS who was seen by Dr. Elease Hashimoto in the past and has been on Propranolol.  She recently saw her PCP and complained of palpitations with HR going up in the 140's while at work and 240bpm when working out.  She is a Engineer, civil (consulting) at [employer] and tells me that she feels she has not been healthy recently.  She says that in the past her HR was 180's when working out and now that is fasting as well as when working.  She denies any dizziness and only notices it when she is active and HR at rest is bradycardic.  She did have COVID 19 a year ago and since then has noticed increased HRs.  She says that she has DOE when she is exerting herself.  She does occasionally have some sharp burning pain on left side of her chest that she only gets about 30 minutes after getting on the Elliptical but not with any other exertion.  She denies any LE edema or syncope.  Her father had an MI at 38.    Past Medical History:  Diagnosis Date   Anemia    Endometriosis    Orthostatic hypotension    POTS (postural orthostatic tachycardia syndrome)    Subchorionic hematoma     Past Surgical History:  Procedure Laterality Date   ENDOMETRIAL ABLATION     LAPAROSCOPIC OVARIAN CYSTECTOMY      Current Medications: Current Meds  Medication Sig   FLUoxetine (PROZAC) 20 MG tablet Take 1 tablet (20 mg total) by mouth daily.   Galcanezumab-gnlm (EMGALITY) 120 MG/ML SOAJ Inject 120 mg into the skin every 30 days.   levonorgestrel-ethinyl estradiol (AVIANE) 0.1-20 MG-MCG tablet Take 1 tablet  by mouth every day. (May skip placebos and take continuously.)   Multiple Vitamin (MULTIVITAMIN PO) Take by mouth daily.   norethindrone-ethinyl estradiol (LOESTRIN) 1-20 MG-MCG tablet TAKE 1 TABLET BY MOUTH ONCE DAILY (TAKE CONTINUOUSLY)   promethazine (PHENERGAN) 25 MG tablet TAKE 1/2-1 TABLETS (12.5-25 MG TOTAL) BY MOUTH EVERY 6 HOURS AS NEEDED FOR NAUSEA OR VOMITING.   propranolol (INDERAL) 10 MG tablet TAKE 1 TABLET BY MOUTH TWICE DAILY   SUMAtriptan (IMITREX) 100 MG tablet Take 1 tablet (100 mg total) by mouth every 2 (two) hours as needed for migraine. May repeat in 2 hours if headache persists or recurs.    Allergies:   Patient has no known allergies.   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   Caffeine: 1 cup coffee in the morning and maybe once a week she has a coke.   Social Determinants of Health   Financial Resource Strain: Not on file  Food Insecurity: Not on file  Transportation Needs: Not on file  Physical Activity: Not on file  Stress: Not on file  Social Connections: Not on file     Family History:  The patient's family history includes Alcohol abuse in her father; Dementia in her father; Diabetes in her maternal grandfather, maternal grandmother, and mother; Heart disease in her father; Hypertension in her father, maternal grandfather, maternal grandmother, and mother; Hyperthyroidism in her sister; Obesity in her mother; Thyroid  disease in her maternal grandmother and sister.   ROS:   Please see the history of present illness.    ROS All other systems reviewed and are negative.  No flowsheet data found.   PHYSICAL EXAM:   VS:  BP 104/70   Pulse (!) 55   Ht 5\' 4"  (1.626 m)   Wt 196 lb 3.2 oz (89 kg)   SpO2 97%   BMI 33.68 kg/m    GEN: Well nourished, well developed, in no acute distress  HEENT: normal  Neck: no JVD, carotid bruits, or masses Cardiac: RRR; no murmurs, rubs, or gallops,no edema.  Intact distal pulses bilaterally.  Respiratory:  clear to auscultation bilaterally, normal work of breathing GI: soft, nontender, nondistended, + BS MS: no deformity or atrophy  Skin: warm and dry, no rash Neuro:  Alert and Oriented x 3, Strength and sensation are intact Psych: euthymic mood, full affect  Wt Readings from Last 3 Encounters:  03/31/21 196 lb 3.2 oz (89 kg)  01/06/21 205 lb 12.8 oz (93.4 kg)  06/02/20 190 lb (86.2 kg)     Studies/Labs Reviewed:   EKG:  EKG is ordered today.  The ekg ordered today demonstrates sinus bradycardia at 55bpm  Recent Labs: 01/06/2021: ALT 23; BUN 10; Creatinine, Ser 0.80; Hemoglobin 13.6; Platelets 316.0; Potassium 4.7; Sodium 139; TSH 2.01   Lipid Panel    Component Value Date/Time   CHOL 207 (H) 01/06/2021 1012   TRIG 134.0 01/06/2021 1012   HDL 49.80 01/06/2021 1012   CHOLHDL 4 01/06/2021 1012   VLDL 26.8 01/06/2021 1012   LDLCALC 130 (H) 01/06/2021 1012    Additional studies/ records that were reviewed today include:  OV notes from PCP  ASSESSMENT:    1. POTS (postural orthostatic tachycardia syndrome)   2. Bradycardia      PLAN:  In order of problems listed above:  POTS -she has been on metoprolol in the past and now on Propranolol  -she drinks a gallon of fluids and has tried gatorade but that has not really helped -she has not tried compression hose -she has just started exercising doing 30 minutes of elliptical 3 times weekly and high  intensity interval traning for 20-30 minutes a few days weekly -I encouraged her to use compression hose while at work -continue current dose of propranolol >cannot increase further due to resting bradycardia -encouraged routine exercise program -check urine Na and osmolality -check am cortisol -check 2D echo to assess LVF  2.  Asymptomatic bradycardia -related to BB use  Time Spent: 25 minutes total time of encounter, including 15 minutes spent in  face-to-face patient care on the date of this encounter. This time includes coordination of care and counseling regarding above mentioned problem list. Remainder of non-face-to-face time involved reviewing chart documents/testing relevant to the patient encounter and documentation in the medical record. I have independently reviewed documentation from referring provider  Medication Adjustments/Labs and Tests Ordered: Current medicines are reviewed at length with the patient today.  Concerns regarding medicines are outlined above.  Medication changes, Labs and Tests ordered today are listed in the Patient Instructions below.  There are no Patient Instructions on file for this visit.   Signed, Armanda Magic, MD  03/31/2021 9:25 AM    [facility] [address] [place], [place], [place]  [address] Phone: [phone]; Fax: [phone]

## 2021-03-31 NOTE — Addendum Note (Signed)
Addended by: Theresia Majors on: 03/31/2021 09:36 AM   Modules accepted: Orders

## 2021-04-01 LAB — SODIUM, URINE, RANDOM: Sodium, Ur: 72 mmol/L

## 2021-04-01 LAB — OSMOLALITY, URINE: Osmolality, Ur: 450 mOsmol/kg

## 2021-04-07 ENCOUNTER — Other Ambulatory Visit (HOSPITAL_COMMUNITY): Payer: Self-pay

## 2021-04-10 ENCOUNTER — Ambulatory Visit (HOSPITAL_COMMUNITY): Payer: No Typology Code available for payment source | Attending: Cardiovascular Disease

## 2021-04-10 ENCOUNTER — Other Ambulatory Visit: Payer: Self-pay

## 2021-04-10 DIAGNOSIS — R001 Bradycardia, unspecified: Secondary | ICD-10-CM | POA: Diagnosis not present

## 2021-04-10 DIAGNOSIS — G90A Postural orthostatic tachycardia syndrome (POTS): Secondary | ICD-10-CM | POA: Insufficient documentation

## 2021-04-10 LAB — ECHOCARDIOGRAM COMPLETE
Area-P 1/2: 3.31 cm2
S' Lateral: 2.9 cm

## 2021-04-21 ENCOUNTER — Other Ambulatory Visit: Payer: Self-pay | Admitting: Neurology

## 2021-04-21 ENCOUNTER — Telehealth (INDEPENDENT_AMBULATORY_CARE_PROVIDER_SITE_OTHER): Payer: No Typology Code available for payment source | Admitting: Family Medicine

## 2021-04-21 ENCOUNTER — Telehealth: Payer: Self-pay | Admitting: *Deleted

## 2021-04-21 ENCOUNTER — Other Ambulatory Visit (HOSPITAL_COMMUNITY): Payer: Self-pay

## 2021-04-21 ENCOUNTER — Other Ambulatory Visit: Payer: Self-pay

## 2021-04-21 ENCOUNTER — Other Ambulatory Visit (HOSPITAL_BASED_OUTPATIENT_CLINIC_OR_DEPARTMENT_OTHER): Payer: Self-pay

## 2021-04-21 DIAGNOSIS — F418 Other specified anxiety disorders: Secondary | ICD-10-CM | POA: Diagnosis not present

## 2021-04-21 DIAGNOSIS — G43909 Migraine, unspecified, not intractable, without status migrainosus: Secondary | ICD-10-CM

## 2021-04-21 MED ORDER — PROMETHAZINE HCL 25 MG PO TABS
ORAL_TABLET | ORAL | 1 refills | Status: DC
  Filled 2021-04-21: qty 20, 5d supply, fill #0
  Filled 2021-07-02: qty 20, 5d supply, fill #1

## 2021-04-21 MED ORDER — SUMATRIPTAN SUCCINATE 100 MG PO TABS
ORAL_TABLET | ORAL | 2 refills | Status: DC
  Filled 2021-04-21: qty 9, 30d supply, fill #0
  Filled 2021-07-02: qty 9, 30d supply, fill #1

## 2021-04-21 MED ORDER — FLUOXETINE HCL 20 MG PO TABS
20.0000 mg | ORAL_TABLET | Freq: Every day | ORAL | 1 refills | Status: DC
  Filled 2021-04-21 – 2021-07-02 (×2): qty 90, 90d supply, fill #0
  Filled 2021-10-05: qty 90, 90d supply, fill #1

## 2021-04-21 NOTE — Telephone Encounter (Signed)
Left message on machine to call back to schedule follow up in 6 months (around 10/20/21)

## 2021-04-22 ENCOUNTER — Other Ambulatory Visit (HOSPITAL_COMMUNITY): Payer: Self-pay

## 2021-04-22 NOTE — Assessment & Plan Note (Signed)
Doing well and following with neurology also. Most of the time if she gets a bad migraine she take Imitrex and Phenergan and goes to sleep and that breaks the HA> refill given on Phenergan today

## 2021-04-22 NOTE — Assessment & Plan Note (Signed)
Good response to Fluoxetine and she is feeling well. Refill given today

## 2021-04-22 NOTE — Progress Notes (Signed)
Virtual telephone visit    Virtual Visit via Telephone Note   This visit type was conducted due to national recommendations for restrictions regarding the COVID-19 Pandemic (e.g. social distancing) in an effort to limit this patient's exposure and mitigate transmission in our community. Due to her co-morbid illnesses, this patient is at least at moderate risk for complications without adequate follow up. This format is felt to be most appropriate for this patient at this time. The patient did not have access to video technology or had technical difficulties with video requiring transitioning to audio format only (telephone). Physical exam was limited to content and character of the telephone converstion. S chism, CMA was able to get the patient set up on a telephone visit.   Patient location: home Patient and provider in visit Provider location: Office  I discussed the limitations of evaluation and management by telemedicine and the availability of in person appointments. The patient expressed understanding and agreed to proceed.   Visit Date: 04/21/2021  Today's healthcare provider: Danise Edge, MD     Subjective:    Patient ID: Jean Phillips, female    DOB: 1991-04-24, 30 y.o.   MRN: [id number]  No chief complaint on file.   HPI Patient is in today for follow up on medication changes. We started Fluoxetine at her last visit and that has been helpful. Less anhedonia and anxiety. No concerning side effects. She is feeling well. No recent febrile illness or hospitalizations. Denies CP/palp/SOB/congestion/fevers/GI or GU c/o. Taking meds as prescribed   Past Medical History:  Diagnosis Date   Anemia    Endometriosis    Orthostatic hypotension    POTS (postural orthostatic tachycardia syndrome)    Subchorionic hematoma     Past Surgical History:  Procedure Laterality Date   ENDOMETRIAL ABLATION     LAPAROSCOPIC OVARIAN CYSTECTOMY      Family History  Problem Relation  Age of Onset   Diabetes Maternal Grandmother    Thyroid disease Maternal Grandmother        hypo, hyper   Hypertension Maternal Grandmother    Diabetes Maternal Grandfather    Hypertension Maternal Grandfather    Hypertension Father    Heart disease Father        s/p MI in 2014, MI   Alcohol abuse Father    Dementia Father    Diabetes Mother    Obesity Mother        s/p gastric bypass   Hypertension Mother    Hyperthyroidism Sister    Thyroid disease Sister        hyper   Migraines Neg Hx     Social History   Socioeconomic History   Marital status: Married    Spouse name: Not on file   Number of children: Not on file   Years of education: Not on file   Highest education level: Not on file  Occupational History   Not on file  Tobacco Use   Smoking status: Former   Smokeless tobacco: Never  Vaping Use   Vaping Use: Never used  Substance and Sexual Activity   Alcohol use: No   Drug use: No   Sexual activity: Yes  Other Topics Concern   Not on file  Social History Narrative   Lives with husband, son    Works with [employer], RN [employer]   No dietary restrictions.   Caffeine: 1 cup coffee in the morning and maybe once a week she has a coke.   Social  Determinants of Health   Financial Resource Strain: Not on file  Food Insecurity: Not on file  Transportation Needs: Not on file  Physical Activity: Not on file  Stress: Not on file  Social Connections: Not on file  Intimate Partner Violence: Not on file    Outpatient Medications Prior to Visit  Medication Sig Dispense Refill   Galcanezumab-gnlm (EMGALITY) 120 MG/ML SOAJ Inject 120 mg into the skin every 30 days. 1 mL 5   levonorgestrel-ethinyl estradiol (AVIANE) 0.1-20 MG-MCG tablet Take 1 tablet by mouth every day. (May skip placebos and take continuously.) 84 tablet 3   Multiple Vitamin (MULTIVITAMIN PO) Take by mouth daily.     propranolol (INDERAL) 10 MG tablet TAKE 1 TABLET BY MOUTH TWICE DAILY 180 tablet 3    SUMAtriptan (IMITREX) 100 MG tablet Take 1 tablet (100 mg total) by mouth every 2 (two) hours as needed for migraine. May repeat in 2 hours if headache persists or recurs. 30 tablet 3   FLUoxetine (PROZAC) 20 MG tablet Take 1 tablet (20 mg total) by mouth daily. 90 tablet 1   norethindrone-ethinyl estradiol (LOESTRIN) 1-20 MG-MCG tablet Take 1 tablet by mouth daily. (Patient not taking: Reported on 03/31/2021) 1 Package 11   norethindrone-ethinyl estradiol (LOESTRIN) 1-20 MG-MCG tablet TAKE 1 TABLET BY MOUTH ONCE DAILY (TAKE CONTINUOUSLY) 21 tablet 1   promethazine (PHENERGAN) 25 MG tablet TAKE 1/2-1 TABLETS (12.5-25 MG TOTAL) BY MOUTH EVERY 6 HOURS AS NEEDED FOR NAUSEA OR VOMITING. 20 tablet 1   No facility-administered medications prior to visit.    No Known Allergies  ROS     Objective:    Physical Exam  There were no vitals taken for this visit. Wt Readings from Last 3 Encounters:  03/31/21 196 lb 3.2 oz (89 kg)  01/06/21 205 lb 12.8 oz (93.4 kg)  06/02/20 190 lb (86.2 kg)    Diabetic Foot Exam - Simple   No data filed    Lab Results  Component Value Date   WBC 4.9 01/06/2021   HGB 13.6 01/06/2021   HCT 40.3 01/06/2021   PLT 316.0 01/06/2021   GLUCOSE 87 01/06/2021   CHOL 207 (H) 01/06/2021   TRIG 134.0 01/06/2021   HDL 49.80 01/06/2021   LDLCALC 130 (H) 01/06/2021   ALT 23 01/06/2021   AST 22 01/06/2021   NA 139 01/06/2021   K 4.7 01/06/2021   CL 106 01/06/2021   CREATININE 0.80 01/06/2021   BUN 10 01/06/2021   CO2 27 01/06/2021   TSH 2.01 01/06/2021   HGBA1C 5.1 05/10/2016    Lab Results  Component Value Date   TSH 2.01 01/06/2021   Lab Results  Component Value Date   WBC 4.9 01/06/2021   HGB 13.6 01/06/2021   HCT 40.3 01/06/2021   MCV 87.7 01/06/2021   PLT 316.0 01/06/2021   Lab Results  Component Value Date   NA 139 01/06/2021   K 4.7 01/06/2021   CO2 27 01/06/2021   GLUCOSE 87 01/06/2021   BUN 10 01/06/2021   CREATININE 0.80 01/06/2021    BILITOT 0.4 01/06/2021   ALKPHOS 56 01/06/2021   AST 22 01/06/2021   ALT 23 01/06/2021   PROT 6.8 01/06/2021   ALBUMIN 4.1 01/06/2021   CALCIUM 9.1 01/06/2021   GFR 100.36 01/06/2021   Lab Results  Component Value Date   CHOL 207 (H) 01/06/2021   Lab Results  Component Value Date   HDL 49.80 01/06/2021   Lab Results  Component Value  Date   LDLCALC 130 (H) 01/06/2021   Lab Results  Component Value Date   TRIG 134.0 01/06/2021   Lab Results  Component Value Date   CHOLHDL 4 01/06/2021   Lab Results  Component Value Date   HGBA1C 5.1 05/10/2016       Assessment & Plan:   Problem List Items Addressed This Visit     Migraine headache    Doing well and following with neurology also. Most of the time if she gets a bad migraine she take Imitrex and Phenergan and goes to sleep and that breaks the HA> refill given on Phenergan today      Relevant Medications   FLUoxetine (PROZAC) 20 MG tablet   Depression with anxiety    Good response to Fluoxetine and she is feeling well. Refill given today      Relevant Medications   FLUoxetine (PROZAC) 20 MG tablet    I have discontinued Aldea Loughney's norethindrone-ethinyl estradiol and norethindrone-ethinyl estradiol. I have also changed her promethazine. Additionally, I am having her maintain her Multiple Vitamin (MULTIVITAMIN PO), SUMAtriptan, Emgality, propranolol, levonorgestrel-ethinyl estradiol, and FLUoxetine.  Meds ordered this encounter  Medications   promethazine (PHENERGAN) 25 MG tablet    Sig: TAKE 1/2 - 1 TABLET BY MOUTH EVERY 6 HOURS AS NEEDED FOR NAUSEA OR VOMITING.    Dispense:  20 tablet    Refill:  1   FLUoxetine (PROZAC) 20 MG tablet    Sig: Take 1 tablet (20 mg total) by mouth daily.    Dispense:  90 tablet    Refill:  1     I discussed the assessment and treatment plan with the patient. The patient was provided an opportunity to ask questions and all were answered. The patient agreed with the plan  and demonstrated an understanding of the instructions.   The patient was advised to call back or seek an in-person evaluation if the symptoms worsen or if the condition fails to improve as anticipated.  I provided 14 minutes of non-face-to-face time during this encounter.   Danise Edge, MD [facility] [phone] (phone) [phone] (fax)  [facility]

## 2021-05-01 ENCOUNTER — Other Ambulatory Visit (HOSPITAL_COMMUNITY): Payer: Self-pay

## 2021-05-01 MED ORDER — LEVONORGESTREL-ETHINYL ESTRAD 0.1-20 MG-MCG PO TABS
ORAL_TABLET | ORAL | 3 refills | Status: DC
  Filled 2021-05-01: qty 84, 63d supply, fill #0
  Filled 2021-07-02: qty 84, 63d supply, fill #1
  Filled 2021-09-04: qty 84, 63d supply, fill #2
  Filled 2021-11-09: qty 84, 63d supply, fill #3

## 2021-05-25 ENCOUNTER — Telehealth: Payer: Self-pay | Admitting: Neurology

## 2021-05-25 NOTE — Telephone Encounter (Signed)
Dr. Lucia Gaskins asked [place] to see if this patient would like to do a VV instead of coming into the office. I left her a voice mail asking her to call us back if she would like to do a VV at the same time of 11am, if not she can come into the office for a regular visit. I will send mychart message as well.

## 2021-06-01 DIAGNOSIS — Z0289 Encounter for other administrative examinations: Secondary | ICD-10-CM

## 2021-06-03 ENCOUNTER — Ambulatory Visit: Payer: Self-pay | Admitting: Neurology

## 2021-06-04 ENCOUNTER — Ambulatory Visit (INDEPENDENT_AMBULATORY_CARE_PROVIDER_SITE_OTHER): Payer: No Typology Code available for payment source | Admitting: Cardiology

## 2021-06-04 ENCOUNTER — Other Ambulatory Visit (HOSPITAL_COMMUNITY): Payer: Self-pay

## 2021-06-04 ENCOUNTER — Encounter: Payer: Self-pay | Admitting: Cardiology

## 2021-06-04 ENCOUNTER — Other Ambulatory Visit: Payer: Self-pay

## 2021-06-04 VITALS — BP 109/79 | HR 62 | Ht 64.0 in | Wt 197.4 lb

## 2021-06-04 DIAGNOSIS — G90A Postural orthostatic tachycardia syndrome (POTS): Secondary | ICD-10-CM | POA: Diagnosis not present

## 2021-06-04 DIAGNOSIS — R42 Dizziness and giddiness: Secondary | ICD-10-CM

## 2021-06-04 DIAGNOSIS — R001 Bradycardia, unspecified: Secondary | ICD-10-CM

## 2021-06-04 MED ORDER — PROPRANOLOL HCL 10 MG PO TABS
ORAL_TABLET | Freq: Two times a day (BID) | ORAL | 3 refills | Status: DC
  Filled 2021-06-04 – 2021-07-02 (×2): qty 180, 90d supply, fill #0
  Filled 2021-10-05: qty 180, 90d supply, fill #1
  Filled 2021-12-22: qty 180, 90d supply, fill #2
  Filled 2022-04-19: qty 180, 90d supply, fill #3

## 2021-06-04 NOTE — Progress Notes (Signed)
Cardiology Note    Date:  06/04/2021   ID:  Jean Phillips, DOB [birth year], MRN [id number]  PCP:  Bradd Canary, MD  Cardiologist:  Armanda Magic, MD   Chief Complaint  Patient presents with   Follow-up    POTS syndrome and bradycardia    History of Present Illness:  Jean Phillips is a 30 y.o. female  with a hx of POTS who was seen by Dr. Elease Hashimoto in the past and has been on Propranolol.  She recently saw her PCP and complained of palpitations with HR going up in the 140's while at work and 240bpm when working out.  She is a Engineer, civil (consulting) at [employer] and has not been healthy recently.  She did have COVID 19 a year ago and since then has noticed increased HRs.  Her father had an MI at 19.    2D echocardiogram 04/10/2021 showed normal LV function with EF 60 to 65% with trivial MR.  TSH was normal.  When I last saw her I encouraged her to use compression hose while at work, continue beta-blocker and get into a routine exercise program.  She is here today for followup.  She is doing much better.  She has not had any further dizzy spells but still feels her heart pounding with minimal movement and it makes her feel SOB.  There is no irregularity to her heart beat.  She denies any chest pain or pressure, LE edema, PND, orthopnea.    Past Medical History:  Diagnosis Date   Anemia    Endometriosis    Orthostatic hypotension    POTS (postural orthostatic tachycardia syndrome)    Subchorionic hematoma     Past Surgical History:  Procedure Laterality Date   ENDOMETRIAL ABLATION     LAPAROSCOPIC OVARIAN CYSTECTOMY      Current Medications: Current Meds  Medication Sig   FLUoxetine (PROZAC) 20 MG tablet Take 1 tablet (20 mg total) by mouth daily.   Galcanezumab-gnlm (EMGALITY) 120 MG/ML SOAJ Inject 120 mg into the skin every 30 days.   levonorgestrel-ethinyl estradiol (AVIANE) 0.1-20 MG-MCG tablet Take 1 tablet by mouth every day   Multiple Vitamin (MULTIVITAMIN PO) Take by mouth daily.    promethazine (PHENERGAN) 25 MG tablet TAKE 1/2 - 1 TABLET BY MOUTH EVERY 6 HOURS AS NEEDED FOR NAUSEA OR VOMITING.   propranolol (INDERAL) 10 MG tablet TAKE 1 TABLET BY MOUTH TWICE DAILY   SUMAtriptan (IMITREX) 100 MG tablet TAKE 1 TABLET BY MOUTH AS NEEDED FOR MIGRAINE, MAY REPEAT IN 2 HOURS IF HEADACHE PERSISTS OR RECURS    Allergies:   Patient has no known allergies.   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   Caffeine: 1 cup coffee in the morning and maybe once a week she has a coke.  Social Determinants of Health   Financial Resource Strain: Not on file  Food Insecurity: Not on file  Transportation Needs: Not on file  Physical Activity: Not on file  Stress: Not on file  Social Connections: Not on file     Family History:  The patient's family history includes Alcohol abuse in her father; Dementia in her father; Diabetes in her maternal grandfather, maternal grandmother, and mother; Heart disease in her father; Hypertension in her father, maternal grandfather, maternal grandmother, and mother; Hyperthyroidism in her sister; Obesity in her mother; Thyroid disease in her maternal grandmother and sister.   ROS:   Please see the history of present illness.    ROS All other systems reviewed and are negative.  No flowsheet data found.   PHYSICAL EXAM:   VS:  BP 109/79   Pulse 62   Ht 5\' 4"  (1.626 m)   Wt 197 lb 6.4 oz (89.5 kg)   SpO2 95%    BMI 33.88 kg/m    GEN: Well nourished, well developed in no acute distress HEENT: Normal NECK: No JVD; No carotid bruits LYMPHATICS: No lymphadenopathy CARDIAC:RRR, no murmurs, rubs, gallops RESPIRATORY:  Clear to auscultation without rales, wheezing or rhonchi  ABDOMEN: Soft, non-tender, non-distended MUSCULOSKELETAL:  No edema; No deformity  SKIN: Warm and dry NEUROLOGIC:  Alert and oriented x 3 PSYCHIATRIC:  Normal affect   Wt Readings from Last 3 Encounters:  06/04/21 197 lb 6.4 oz (89.5 kg)  03/31/21 196 lb 3.2 oz (89 kg)  01/06/21 205 lb 12.8 oz (93.4 kg)     Studies/Labs Reviewed:   EKG:  EKG is not ordered today.    Recent Labs: 01/06/2021: ALT 23; BUN 10; Creatinine, Ser 0.80; Hemoglobin 13.6; Platelets 316.0; Potassium 4.7; Sodium 139; TSH 2.01   Lipid Panel    Component Value Date/Time   CHOL 207 (H) 01/06/2021 1012   TRIG 134.0 01/06/2021 1012   HDL 49.80 01/06/2021 1012   CHOLHDL 4 01/06/2021 1012   VLDL 26.8 01/06/2021 1012   LDLCALC 130 (H) 01/06/2021 1012    Additional studies/ records that were reviewed today include:  OV notes from PCP  ASSESSMENT:    1. POTS (postural orthostatic tachycardia syndrome)   2. Bradycardia      PLAN:  In order of problems listed above:  POTS - she drinks a gallon of fluids  -2D echocardiogram showed normal LV function no valvular heart disease -TSH was normal - check am Cortisol - She is now using compression hose while at work and trying to stay hydrated and has gotten into an exercise routine and is feeling better - encouraged her to try to be better about routine exercise which will help with the fast HRs with movement  2.  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  Concerns regarding medicines are outlined above.  Medication changes, Labs and Tests ordered today are listed in the Patient  Instructions below.  There are no Patient Instructions on file for this visit.   Signed, 03/09/2021, MD  06/04/2021 9:30 AM    [facility] [address] [place], [place], [place]  [place] Phone: [phone]; Fax: [phone]

## 2021-06-04 NOTE — Addendum Note (Signed)
Addended by: Vernard Gambles on: 06/04/2021 09:39 AM   Modules accepted: Orders

## 2021-06-04 NOTE — Patient Instructions (Signed)
Medication Instructions:  Your physician recommends that you continue on your current medications as directed. Please refer to the Current Medication list given to you today.   *If you need a refill on your cardiac medications before your next appointment, please call your pharmacy*   Lab Work: Your physician recommends that you return for lab work on 06/05/2021 at 8 AM   If you have labs (blood work) drawn today and your tests are completely normal, you will receive your results only by: MyChart Message (if you have MyChart) OR A paper copy in the mail If you have any lab test that is abnormal or we need to change your treatment, we will call you to review the results.   Testing/Procedures: None ordered    Follow-Up: At [facility], you and your health needs are our priority.  As part of our continuing mission to provide you with exceptional heart care, we have created designated Provider Care Teams.  These Care Teams include your primary Cardiologist (physician) and Advanced Practice Providers (APPs -  Physician Assistants and Nurse Practitioners) who all work together to provide you with the care you need, when you need it.  We recommend signing up for the patient portal called "MyChart".  Sign up information is provided on this After Visit Summary.  MyChart is used to connect with patients for Virtual Visits (Telemedicine).  Patients are able to view lab/test results, encounter notes, upcoming appointments, etc.  Non-urgent messages can be sent to your provider as well.   To learn more about what you can do with MyChart, go to [email or web address].    Your next appointment:   12 month(s)  The format for your next appointment:   In Person  Provider:   Armanda Magic, MD     Other Instructions None

## 2021-06-05 ENCOUNTER — Other Ambulatory Visit: Payer: No Typology Code available for payment source | Admitting: *Deleted

## 2021-06-05 DIAGNOSIS — R42 Dizziness and giddiness: Secondary | ICD-10-CM

## 2021-06-06 LAB — CORTISOL: Cortisol: 26.6 ug/dL

## 2021-06-07 NOTE — Progress Notes (Deleted)
[facility]    Provider:  Dr Lucia Gaskins Requesting Provider: Bradd Canary, MD Primary Care Provider:  Bradd Canary, MD  CC:  Migraines  On emgality. He has aPMHx POTS, migraines, depression with anxiety.   Interval history 06/01/2020: Patient with chronic migraines was started on Ajovy. Here for follow up. Baseline was 23 headache days out of the month, and 10 -12 moderately severe to severe migraines a month. Now 10 headache days which includes 5 migraine days. And imitrex helps acutely. Nurtec didn't help. She is extremely happy.  MRI brain 02/13/2020:   IMPRESSION: This MRI of the brain with and without contrast shows the following: 1.   The brain appears normal before and after contrast. 2.   Mild maxillary and ethmoid chronic sinusitis. 3.   Left mastoid effusion.  This could be due to eustachian tube dysfunction. 4.   There is a normal enhancement pattern no acute findings.  HPI:  Jean Phillips is a 30 y.o. female here as requested by Bradd Canary, MD for migraines. She has a PMHx of POTS, migraines. I reviewed Dr. Mariel Aloe notes: Patient was last seen for annual preventative exam, this was at the end of June of this year, no recent illnesses, she is feeling well, continue to stay active and maintaining a healthy diet, she discussed migraine headaches with Dr. Abner Greenspan who encouraged increased hydration, minimizing alcohol and caffeine, eating small frequent meals with lean proteins and complex carbs and avoiding high and low blood sugars, getting adequate sleep 78 hours a night, and exercising.  Physical examination was normal.  For her migraines it appears she is on Imitrex 100 mg and propranolol 10 mg.  I reviewed TSH, CMP and CBC which were all normal.  I do not see that patient had brain imaging in the notes I have available.  She has had migraines since she was a kid, she reports having them in the fourth grade and that is when they started to be migraines and  not just headaches, as she is gotten older they have gotten more frequent and more severe, she is tried multiple medications if is just a headache analgesics little help if it is a migraine over-the-counter meds will not work, she has had a headache since Friday.Patient's migraines start behind te eyes maybe unilateral but either side, behind her ears and the top of the neck, temples, pulsating/pounding and throbbing and sharper, nausea, light and sound sensitivity, they can last 24-72 hours, sumatriptan may help if she sleeps, she can wake up in the morning with headaches, vision changes and blurry vision, slowly progressive through the years. Over the last year it has worsened and she has 23 headache days out of the month, and 10 -12 moderately severe to severe migraines a month. No medication overuse. No aura. She has tried to narrow down triggers, better when she is exercing but she takes continuous birth control so not menstrual, she tried cutting out caffeine, at work they seem to be worse, blistering migraine severe, she tries to drink, she tried to eat well. No family history of migraines. No other focal neurologic deficits, associated symptoms, inciting events or modifiable factors.  From a thorough review of records, meds tried that can be used in migraine prevention include: Tylenol, propranolol, Imitrex, ibuprofen, labetalol, metoprolol, Zofran injections and tablets, Percocet, prednisone tablets, Phenergan tablets, rizatriptan,  Reviewed notes, labs and imaging from outside physicians, which showed: see above  Review of Systems: Patient complains of symptoms per  HPI as well as the following symptoms: headache . Pertinent negatives and positives per HPI. All others negative    Social History   Socioeconomic History   Marital status: Married    Spouse name: Not on file   Number of children: Not on file   Years of education: Not on file   Highest education level: Not on file   Occupational History   Not on file  Tobacco Use   Smoking status: Former   Smokeless tobacco: Never  Vaping Use   Vaping Use: Never used  Substance and Sexual Activity   Alcohol use: No   Drug use: No   Sexual activity: Yes  Other Topics Concern   Not on file  Social History Narrative   Lives with husband, son    Works with [employer], RN [employer]   No dietary restrictions.   Caffeine: 1 cup coffee in the morning and maybe once a week she has a coke.   Social Determinants of Health   Financial Resource Strain: Not on file  Food Insecurity: Not on file  Transportation Needs: Not on file  Physical Activity: Not on file  Stress: Not on file  Social Connections: Not on file  Intimate Partner Violence: Not on file    Family History  Problem Relation Age of Onset   Diabetes Maternal Grandmother    Thyroid disease Maternal Grandmother        hypo, hyper   Hypertension Maternal Grandmother    Diabetes Maternal Grandfather    Hypertension Maternal Grandfather    Hypertension Father    Heart disease Father        s/p MI in 2014, MI   Alcohol abuse Father    Dementia Father    Diabetes Mother    Obesity Mother        s/p gastric bypass   Hypertension Mother    Hyperthyroidism Sister    Thyroid disease Sister        hyper   Migraines Neg Hx     Past Medical History:  Diagnosis Date   Anemia    Endometriosis    Orthostatic hypotension    POTS (postural orthostatic tachycardia syndrome)    Subchorionic hematoma     Patient Active Problem List   Diagnosis Date Noted   Class 2 obesity with body mass index (BMI) of 36.0 to 36.9 in adult 01/06/2021   Insomnia 01/06/2021   Depression with anxiety 01/06/2021   Sun-damaged skin 01/07/2020   Low back pain 04/28/2018   Migraine headache 07/08/2017   Sinus tachycardia 06/18/2016   Preventative health care 05/16/2016   Hypoglycemia 05/10/2016   POTS (postural orthostatic tachycardia syndrome)    Endometriosis     Past  Surgical History:  Procedure Laterality Date   ENDOMETRIAL ABLATION     LAPAROSCOPIC OVARIAN CYSTECTOMY      Current Outpatient Medications  Medication Sig Dispense Refill   FLUoxetine (PROZAC) 20 MG tablet Take 1 tablet (20 mg total) by mouth daily. 90 tablet 1   Galcanezumab-gnlm (EMGALITY) 120 MG/ML SOAJ Inject 120 mg into the skin every 30 days. 1 mL 5   levonorgestrel-ethinyl estradiol (AVIANE) 0.1-20 MG-MCG tablet Take 1 tablet by mouth every day 84 tablet 3   Multiple Vitamin (MULTIVITAMIN PO) Take by mouth daily.     promethazine (PHENERGAN) 25 MG tablet TAKE 1/2 - 1 TABLET BY MOUTH EVERY 6 HOURS AS NEEDED FOR NAUSEA OR VOMITING. 20 tablet 1   propranolol (INDERAL) 10 MG tablet TAKE  1 TABLET BY MOUTH TWICE DAILY 180 tablet 3   SUMAtriptan (IMITREX) 100 MG tablet TAKE 1 TABLET BY MOUTH AS NEEDED FOR MIGRAINE, MAY REPEAT IN 2 HOURS IF HEADACHE PERSISTS OR RECURS 10 tablet 2   No current facility-administered medications for this visit.    Allergies as of 06/08/2021   (No Known Allergies)    Vitals: There were no vitals taken for this visit. Last Weight:  Wt Readings from Last 1 Encounters:  06/04/21 197 lb 6.4 oz (89.5 kg)   Last Height:   Ht Readings from Last 1 Encounters:  06/04/21 5\' 4"  (1.626 m)    Exam: NAD, pleasant                  Speech:    Speech is normal; fluent and spontaneous with normal comprehension.  Cognition:    The patient is oriented to person, place, and time;     recent and remote memory intact;     language fluent;    Cranial Nerves:    The pupils are equal, round, and reactive to light.Trigeminal sensation is intact and the muscles of mastication are normal. The face is symmetric. The palate elevates in the midline. Hearing intact. Voice is normal. Shoulder shrug is normal. The tongue has normal motion without fasciculations.   Coordination:  No dysmetria  Motor Observation:    No asymmetry, no atrophy, and no involuntary movements  noted. Tone:    Normal muscle tone.     Strength:    Strength is V/V in the upper and lower limbs.      Sensation: intact to LT      Assessment/Plan:  30 year old with chronic migraines..Patient with chronic migraines was started on Ajovy. Here for follow up. Baseline was 23 headache days out of the month, and 10 -12 moderately severe to severe migraines a month. Now 10 headache days which includes 5 migraine days. And imitrex helps acutely. Nurtec didn't help. She is extremely happy.continue  MRI brain unremarkable, reviewed images with patient today  Preventative: Ajovy Acute management: Nurtec did not work, stay with Imitrex   No orders of the defined types were placed in this encounter.  No orders of the defined types were placed in this encounter.  Discussed: To prevent or relieve headaches, try the following: Cool Compress. Lie down and place a cool compress on your head.  Avoid headache triggers. If certain foods or odors seem to have triggered your migraines in the past, avoid them. A headache diary might help you identify triggers.  Include physical activity in your daily routine. Try a daily walk or other moderate aerobic exercise.  Manage stress. Find healthy ways to cope with the stressors, such as delegating tasks on your to-do list.  Practice relaxation techniques. Try deep breathing, yoga, massage and visualization.  Eat regularly. Eating regularly scheduled meals and maintaining a healthy diet might help prevent headaches. Also, drink plenty of fluids.  Follow a regular sleep schedule. Sleep deprivation might contribute to headaches Consider biofeedback. With this mind-body technique, you learn to control certain bodily functions -- such as muscle tension, heart rate and blood pressure -- to prevent headaches or reduce headache pain.    Proceed to emergency room if you experience new or worsening symptoms or symptoms do not resolve, if you have new neurologic  symptoms or if headache is severe, or for any concerning symptom.   Provided education and documentation from American headache Society toolbox including articles on: chronic  migraine medication overuse headache, chronic migraines, prevention of migraines, behavioral and other nonpharmacologic treatments for headache.  Cc: Bradd Canary, MD,  Bradd Canary, MD  Naomie Dean, MD  [facility] [address] [place], [place] [address]  I spent over 20  minutes of face-to-face and non-face-to-face time with patient on the  No diagnosis found.  diagnosis.  This included previsit chart review, lab review, study review, order entry, electronic health record documentation, patient education on the different diagnostic and therapeutic options, counseling and coordination of care, risks and benefits of management, compliance, or risk factor reduction  Phone [phone] Fax [phone]

## 2021-06-08 ENCOUNTER — Telehealth: Payer: Self-pay

## 2021-06-08 ENCOUNTER — Telehealth: Payer: Self-pay | Admitting: Neurology

## 2021-06-08 DIAGNOSIS — R42 Dizziness and giddiness: Secondary | ICD-10-CM

## 2021-06-08 DIAGNOSIS — G90A Postural orthostatic tachycardia syndrome (POTS): Secondary | ICD-10-CM

## 2021-06-08 DIAGNOSIS — R001 Bradycardia, unspecified: Secondary | ICD-10-CM

## 2021-06-08 NOTE — Telephone Encounter (Signed)
The patient has been notified of the result and verbalized understanding.  All questions (if any) were answered. Theresia Majors, RN 06/08/2021 5:13 PM

## 2021-06-08 NOTE — Telephone Encounter (Signed)
-----   Message from Loa Socks, LPN sent at 32/03/9241  7:34 AM EST -----  ----- Message ----- From: Quintella Reichert, MD Sent: 06/07/2021   9:55 AM EST To: Bradd Canary, MD, [facility] Triage  Am cortisol level elevated ? Significance - please get a 24 hr urine cortisol level

## 2021-06-10 ENCOUNTER — Encounter: Payer: Self-pay | Admitting: Adult Health

## 2021-06-10 ENCOUNTER — Telehealth: Payer: Self-pay | Admitting: Adult Health

## 2021-06-30 LAB — CORTISOL, FREE 24 HR URINE
CREATININE, URINE: 1.43 g/(24.h) (ref 0.50–2.15)
Cortisol (Ur), Free: 37.9 mcg/24 h (ref 4.0–50.0)
Cortisol, Free ratio to CRT: 26.5 mcg/g creat
Total Volume: 4000 mL

## 2021-07-02 ENCOUNTER — Other Ambulatory Visit (HOSPITAL_COMMUNITY): Payer: Self-pay

## 2021-07-03 ENCOUNTER — Other Ambulatory Visit (HOSPITAL_COMMUNITY): Payer: Self-pay

## 2021-07-08 ENCOUNTER — Other Ambulatory Visit: Payer: Self-pay

## 2021-07-08 ENCOUNTER — Encounter (INDEPENDENT_AMBULATORY_CARE_PROVIDER_SITE_OTHER): Payer: Self-pay | Admitting: Family Medicine

## 2021-07-08 ENCOUNTER — Ambulatory Visit (INDEPENDENT_AMBULATORY_CARE_PROVIDER_SITE_OTHER): Payer: No Typology Code available for payment source | Admitting: Family Medicine

## 2021-07-08 VITALS — BP 98/64 | HR 88 | Temp 98.7°F | Ht 64.0 in | Wt 193.0 lb

## 2021-07-08 DIAGNOSIS — G90A Postural orthostatic tachycardia syndrome (POTS): Secondary | ICD-10-CM | POA: Diagnosis not present

## 2021-07-08 DIAGNOSIS — E7849 Other hyperlipidemia: Secondary | ICD-10-CM

## 2021-07-08 DIAGNOSIS — F419 Anxiety disorder, unspecified: Secondary | ICD-10-CM

## 2021-07-08 DIAGNOSIS — R5383 Other fatigue: Secondary | ICD-10-CM | POA: Diagnosis not present

## 2021-07-08 DIAGNOSIS — R0602 Shortness of breath: Secondary | ICD-10-CM

## 2021-07-08 DIAGNOSIS — F32A Depression, unspecified: Secondary | ICD-10-CM | POA: Diagnosis not present

## 2021-07-08 DIAGNOSIS — E669 Obesity, unspecified: Secondary | ICD-10-CM

## 2021-07-08 DIAGNOSIS — G43909 Migraine, unspecified, not intractable, without status migrainosus: Secondary | ICD-10-CM

## 2021-07-08 DIAGNOSIS — Z6833 Body mass index (BMI) 33.0-33.9, adult: Secondary | ICD-10-CM

## 2021-07-09 LAB — CBC WITH DIFFERENTIAL/PLATELET
Basophils Absolute: 0 10*3/uL (ref 0.0–0.2)
Basos: 0 %
EOS (ABSOLUTE): 0.1 10*3/uL (ref 0.0–0.4)
Eos: 1 %
Hematocrit: 43.5 % (ref 34.0–46.6)
Hemoglobin: 14.3 g/dL (ref 11.1–15.9)
Immature Grans (Abs): 0 10*3/uL (ref 0.0–0.1)
Immature Granulocytes: 0 %
Lymphocytes Absolute: 2.2 10*3/uL (ref 0.7–3.1)
Lymphs: 32 %
MCH: 28.7 pg (ref 26.6–33.0)
MCHC: 32.9 g/dL (ref 31.5–35.7)
MCV: 87 fL (ref 79–97)
Monocytes Absolute: 0.5 10*3/uL (ref 0.1–0.9)
Monocytes: 6 %
Neutrophils Absolute: 4.2 10*3/uL (ref 1.4–7.0)
Neutrophils: 61 %
Platelets: 384 10*3/uL (ref 150–450)
RBC: 4.98 x10E6/uL (ref 3.77–5.28)
RDW: 12.4 % (ref 11.7–15.4)
WBC: 7 10*3/uL (ref 3.4–10.8)

## 2021-07-09 LAB — VITAMIN B12: Vitamin B-12: 312 pg/mL (ref 232–1245)

## 2021-07-09 LAB — TSH: TSH: 1.35 u[IU]/mL (ref 0.450–4.500)

## 2021-07-09 LAB — T4, FREE: Free T4: 1.11 ng/dL (ref 0.82–1.77)

## 2021-07-09 LAB — COMPREHENSIVE METABOLIC PANEL
ALT: 18 IU/L (ref 0–32)
AST: 20 IU/L (ref 0–40)
Albumin/Globulin Ratio: 1.5 (ref 1.2–2.2)
Albumin: 4.3 g/dL (ref 3.9–5.0)
Alkaline Phosphatase: 77 IU/L (ref 44–121)
BUN/Creatinine Ratio: 19 (ref 9–23)
BUN: 15 mg/dL (ref 6–20)
Bilirubin Total: 0.4 mg/dL (ref 0.0–1.2)
CO2: 21 mmol/L (ref 20–29)
Calcium: 9.3 mg/dL (ref 8.7–10.2)
Chloride: 103 mmol/L (ref 96–106)
Creatinine, Ser: 0.79 mg/dL (ref 0.57–1.00)
Globulin, Total: 2.8 g/dL (ref 1.5–4.5)
Glucose: 82 mg/dL (ref 70–99)
Potassium: 5 mmol/L (ref 3.5–5.2)
Sodium: 139 mmol/L (ref 134–144)
Total Protein: 7.1 g/dL (ref 6.0–8.5)
eGFR: 104 mL/min/{1.73_m2} (ref >59)

## 2021-07-09 LAB — LIPID PANEL WITH LDL/HDL RATIO
Cholesterol, Total: 242 mg/dL — ABNORMAL HIGH (ref 100–199)
HDL: 49 mg/dL (ref >39)
LDL Chol Calc (NIH): 166 mg/dL — ABNORMAL HIGH (ref 0–99)
LDL/HDL Ratio: 3.4 ratio — ABNORMAL HIGH (ref 0.0–3.2)
Triglycerides: 148 mg/dL (ref 0–149)
VLDL Cholesterol Cal: 27 mg/dL (ref 5–40)

## 2021-07-09 LAB — HEMOGLOBIN A1C
Est. average glucose Bld gHb Est-mCnc: 108 mg/dL
Hgb A1c MFr Bld: 5.4 % (ref 4.8–5.6)

## 2021-07-09 LAB — T3: T3, Total: 157 ng/dL (ref 71–180)

## 2021-07-09 LAB — INSULIN, RANDOM: INSULIN: 7.2 u[IU]/mL (ref 2.6–24.9)

## 2021-07-09 LAB — FOLATE: Folate: 10.3 ng/mL (ref >3.0)

## 2021-07-09 LAB — VITAMIN D 25 HYDROXY (VIT D DEFICIENCY, FRACTURES): Vit D, 25-Hydroxy: 23.7 ng/mL — ABNORMAL LOW (ref 30.0–100.0)

## 2021-07-09 NOTE — Progress Notes (Signed)
Dear Dr. Abner Greenspan,   Thank you for referring Jean Phillips to our clinic. The following note includes my evaluation and treatment recommendations.  Chief Complaint:   OBESITY Jean Phillips (MR# [id number]) is a 31 y.o. female who presents for evaluation and treatment of obesity and related comorbidities. Current BMI is Body mass index is 33.13 kg/m. Jean Phillips has been struggling with her weight for many years and has been unsuccessful in either losing weight, maintaining weight loss, or reaching her healthy weight goal.  Jean Phillips is currently in the action stage of change and ready to dedicate time achieving and maintaining a healthier weight. Jean Phillips is interested in becoming our patient and working on intensive lifestyle modifications including (but not limited to) diet and exercise for weight loss.  Jean Phillips works at [employer]. She heard about the clinic from [facility]. Her desired weight is 145 lbs- h/s + 1st few years of marriage. Pt is eating out 5 times a week (Cookout- quesadilla + cheese bits + Coke). She skips breakfast daily. Coffee in the morning with caramel protein (pre-made shake 1/3); ~10-11 AM- pre packaged chips; Lunch- salad with blackened chicken strips (3-4 strips) + fruit (satisfied); 2-3 PM- Hershey kisses (1 bag) or 1/2 bag of chips (regular size bag) (feels poorly due to food choices); Dinner- chicken, brown rice, vegetable, or pizza.   Jean Phillips's habits were reviewed today and are as follows: Her family eats meals together, she thinks her family will eat healthier with her, her desired weight loss is 48 lbs, she started gaining weight in 2016, her heaviest weight ever was 195 pounds, she has significant food cravings issues, she skips meals frequently, she is frequently drinking liquids with calories, she frequently makes poor food choices, she frequently eats larger portions than normal, she has binge eating behaviors, and she struggles with emotional eating.  Depression Screen Jean Phillips's  Food and Mood (modified PHQ-9) score was 13.  Depression screen PHQ 2/9 07/08/2021  Decreased Interest 2  Down, Depressed, Hopeless 2  PHQ - 2 Score 4  Altered sleeping 2  Tired, decreased energy 3  Change in appetite 2  Feeling bad or failure about yourself  1  Trouble concentrating 1  Moving slowly or fidgety/restless 0  Suicidal thoughts 0  PHQ-9 Score 13  Difficult doing work/chores Somewhat difficult   Subjective:   1. Other fatigue Jean Phillips admits to daytime somnolence and admits to waking up still tired. Patent has a history of symptoms of daytime fatigue, morning fatigue, and Epworth sleepiness scale. Jean Phillips generally gets  6-8  hours of sleep per night, and states that she has poor sleep quality. Snoring is present. Apneic episodes are not present. Epworth Sleepiness Score is 8. EKG normal sinus rhythm at 80 bpm.  2. SOB (shortness of breath) Jean Phillips notes increasing shortness of breath with exercising and seems to be worsening over time with weight gain. She notes getting out of breath sooner with activity than she used to. This has gotten worse recently. Jean Phillips denies shortness of breath at rest or orthopnea.  3. POTS (postural orthostatic tachycardia syndrome) Pt sees Dr. Mayford Knife. She reports minimal symptoms- mostly tachycardia and fatigue.  4. Anxiety and depression She is on fluoxetine 20 mg with no current symptoms. Pt is feeling very well controlled.  5. Migraine without status migrainosus, not intractable, unspecified migraine type Pt sees neurology and is on sumatriptan prn.  6. Other hyperlipidemia Pt has an LDL of 130, HDL 49, and triglycerides 681. She is not  on statin therapy.  Assessment/Plan:   1. Other fatigue Jean Phillips does feel that her weight is causing her energy to be lower than it should be. Fatigue may be related to obesity, depression or many other causes. Labs will be ordered, and in the meanwhile, Jean Phillips will focus on self care including making healthy food  choices, increasing physical activity and focusing on stress reduction. Check labs today.  - EKG 12-Lead - Vitamin B12 - Comprehensive metabolic panel - Folate - Hemoglobin A1c - Insulin, random - T3 - T4, free - TSH - VITAMIN D 25 Hydroxy (Vit-D Deficiency, Fractures)  2. SOB (shortness of breath) Jean Phillips does feel that she gets out of breath more easily that she used to when she exercises. Syan's shortness of breath appears to be obesity related and exercise induced. She has agreed to work on weight loss and gradually increase exercise to treat her exercise induced shortness of breath. Will continue to monitor closely. Check labs today.  - CBC with Differential/Platelet  3. POTS (postural orthostatic tachycardia syndrome) Follow up with Dr. Mayford Knifeurner.  4. Anxiety and depression Behavior modification techniques were discussed today to help Jean Phillips deal with her anxiety.  Orders and follow up as documented in patient record. Taper to every other day x 1 week, then every 3rd day x 1 week.  5. Migraine without status migrainosus, not intractable, unspecified migraine type Follow up with neurology as needed.  6. Other hyperlipidemia Cardiovascular risk and specific lipid/LDL goals reviewed.  We discussed several lifestyle modifications today and Jean Phillips will continue to work on diet, exercise and weight loss efforts. Orders and follow up as documented in patient record.   Counseling Intensive lifestyle modifications are the first line treatment for this issue. Dietary changes: Increase soluble fiber. Decrease simple carbohydrates. Exercise changes: Moderate to vigorous-intensity aerobic activity 150 minutes per week if tolerated. Lipid-lowering medications: see documented in medical record. Check labs today.  - Lipid Panel With LDL/HDL Ratio  7. Obesity with current BMI of 33.2  Jean Phillips is currently in the action stage of change and her goal is to continue with weight loss efforts. I  recommend Jean Phillips begin the structured treatment plan as follows:  She has agreed to the Category 3 Plan.  Exercise goals: No exercise has been prescribed at this time.   Behavioral modification strategies: increasing lean protein intake, meal planning and cooking strategies, keeping healthy foods in the home, and planning for success.  She was informed of the importance of frequent follow-up visits to maximize her success with intensive lifestyle modifications for her multiple health conditions. She was informed we would discuss her lab results at her next visit unless there is a critical issue that needs to be addressed sooner. Rubylee agreed to keep her next visit at the agreed upon time to discuss these results.  Objective:   Blood pressure 98/64, pulse 88, temperature 98.7 F (37.1 C), height 5\' 4"  (1.626 m), weight 193 lb (87.5 kg), SpO2 97 %. Body mass index is 33.13 kg/m.  EKG: Normal sinus rhythm, rate 80.  Indirect Calorimeter completed today shows a VO2 of 247 and a REE of 1699.  Her calculated basal metabolic rate is [id number] thus her basal metabolic rate is better than expected.  General: Cooperative, alert, well developed, in no acute distress. HEENT: Conjunctivae and lids unremarkable. Cardiovascular: Regular rhythm.  Lungs: Normal work of breathing. Neurologic: No focal deficits.   Lab Results  Component Value Date   CREATININE 0.79 07/08/2021   BUN  15 07/08/2021   NA 139 07/08/2021   K 5.0 07/08/2021   CL 103 07/08/2021   CO2 21 07/08/2021   Lab Results  Component Value Date   ALT 18 07/08/2021   AST 20 07/08/2021   ALKPHOS 77 07/08/2021   BILITOT 0.4 07/08/2021   Lab Results  Component Value Date   HGBA1C 5.4 07/08/2021   HGBA1C 5.1 05/10/2016   Lab Results  Component Value Date   INSULIN 7.2 07/08/2021   Lab Results  Component Value Date   TSH 1.350 07/08/2021   Lab Results  Component Value Date   CHOL 242 (H) 07/08/2021   HDL 49 07/08/2021    LDLCALC 166 (H) 07/08/2021   TRIG 148 07/08/2021   CHOLHDL 4 01/06/2021   Lab Results  Component Value Date   WBC 7.0 07/08/2021   HGB 14.3 07/08/2021   HCT 43.5 07/08/2021   MCV 87 07/08/2021   PLT 384 07/08/2021    Attestation Statements:   Reviewed by clinician on day of visit: allergies, medications, problem list, medical history, surgical history, family history, social history, and previous encounter notes.  Edmund Hilda, CMA, am acting as transcriptionist for Reuben Likes, MD.   This is the patient's first visit at [facility]. The patient's NEW PATIENT PACKET was reviewed at length. Included in the packet: current and past health history, medications, allergies, ROS, gynecologic history (women only), surgical history, family history, social history, weight history, weight loss surgery history (for those that have had weight loss surgery), nutritional evaluation, mood and food questionnaire, PHQ9, Epworth questionnaire, sleep habits questionnaire, patient life and health improvement goals questionnaire. These will all be scanned into the patient's chart under media.   During the visit, I independently reviewed the patient's EKG, bioimpedance scale results, and indirect calorimeter results. I used this information to tailor a meal plan for the patient that will help her to lose weight and will improve her obesity-related conditions going forward. I performed a medically necessary appropriate examination and/or evaluation. I discussed the assessment and treatment plan with the patient. The patient was provided an opportunity to ask questions and all were answered. The patient agreed with the plan and demonstrated an understanding of the instructions. Labs were ordered at this visit and will be reviewed at the next visit unless more critical results need to be addressed immediately. Clinical information was updated and documented in the EMR.   Time spent on visit  including pre-visit chart review and post-visit care was 45 minutes.   A separate 15 minutes was spent on risk counseling (see above).  I have reviewed the above documentation for accuracy and completeness, and I agree with the above. - Reuben Likes, MD

## 2021-07-22 ENCOUNTER — Other Ambulatory Visit (HOSPITAL_COMMUNITY): Payer: Self-pay

## 2021-07-22 ENCOUNTER — Other Ambulatory Visit: Payer: Self-pay

## 2021-07-22 ENCOUNTER — Encounter (INDEPENDENT_AMBULATORY_CARE_PROVIDER_SITE_OTHER): Payer: Self-pay | Admitting: Family Medicine

## 2021-07-22 ENCOUNTER — Ambulatory Visit (INDEPENDENT_AMBULATORY_CARE_PROVIDER_SITE_OTHER): Payer: No Typology Code available for payment source | Admitting: Family Medicine

## 2021-07-22 VITALS — BP 111/75 | HR 94 | Temp 98.4°F | Ht 64.0 in | Wt 197.0 lb

## 2021-07-22 DIAGNOSIS — E669 Obesity, unspecified: Secondary | ICD-10-CM | POA: Diagnosis not present

## 2021-07-22 DIAGNOSIS — E559 Vitamin D deficiency, unspecified: Secondary | ICD-10-CM

## 2021-07-22 DIAGNOSIS — E8881 Metabolic syndrome: Secondary | ICD-10-CM

## 2021-07-22 DIAGNOSIS — E7849 Other hyperlipidemia: Secondary | ICD-10-CM

## 2021-07-22 DIAGNOSIS — Z6833 Body mass index (BMI) 33.0-33.9, adult: Secondary | ICD-10-CM

## 2021-07-22 MED ORDER — VITAMIN D (ERGOCALCIFEROL) 1.25 MG (50000 UNIT) PO CAPS
50000.0000 [IU] | ORAL_CAPSULE | ORAL | 0 refills | Status: DC
  Filled 2021-07-22: qty 4, 28d supply, fill #0

## 2021-07-23 NOTE — Progress Notes (Signed)
Chief Complaint:   OBESITY Jean Phillips is here to discuss her progress with her obesity treatment plan along with follow-up of her obesity related diagnoses. Jean Phillips is on the Category 3 Plan and states she is following her eating plan approximately 60% of the time. Jean Phillips states she is doing HIIT 30 minutes 1 times per week.  Today's visit was #: 2 Starting weight: 193 lbs Starting date: 07/08/2021 Today's weight: 197 lbs Today's date: 07/22/2021 Total lbs lost to date: 0 Total lbs lost since last in-office visit: 0  Interim History: Pt tried to taylor plan more to what she eats. She went out of town the second week and ended up eating out more frequently. She has no upcoming plans, except work and school. Pt is eating premier protein cereal but was wondering about almond milk, otherwise, she is skipping breakfast. She is wondering if she can move calories around.  Subjective:   1. Other hyperlipidemia Discussed labs with patient today. Pt has an LDL of 166, HDL 49, and triglycerides 148. She is not on a statin. Can't risk stratify due to age.  2. Vitamin D deficiency New. Discussed labs with patient today. Pt's Vit D level is 23.7 and she is not on Vit D supplementation.  3. Insulin resistance Discussed labs with patient today. Pt's A1c is 5.4 with an insulin level of 7.2. She has some drive/desire for carbohydrates.  Assessment/Plan:   1. Other hyperlipidemia Cardiovascular risk and specific lipid/LDL goals reviewed.  We discussed several lifestyle modifications today and Jean Phillips will continue to work on diet, exercise and weight loss efforts. Orders and follow up as documented in patient record. Repeat labs in April.  Counseling Intensive lifestyle modifications are the first line treatment for this issue. Dietary changes: Increase soluble fiber. Decrease simple carbohydrates. Exercise changes: Moderate to vigorous-intensity aerobic activity 150 minutes per week if  tolerated. Lipid-lowering medications: see documented in medical record.  2. Vitamin D deficiency Low Vitamin D level contributes to fatigue and are associated with obesity, breast, and colon cancer. She agrees to start to take prescription Vitamin D 50,000 IU every week and will follow-up for routine testing of Vitamin D, at least 2-3 times per year to avoid over-replacement.  Start- Vitamin D, Ergocalciferol, (DRISDOL) 1.25 MG (50000 UNIT) CAPS capsule; Take 1 capsule (50,000 Units total) by mouth every 7 (seven) days.  Dispense: 4 capsule; Refill: 0  3. Insulin resistance Jean Phillips will continue to work on weight loss, exercise, and decreasing simple carbohydrates to help decrease the risk of diabetes. Jean Phillips agreed to follow-up with [place] as directed to closely monitor her progress. Repeat labs in 3 months.  4. Obesity with current BMI of 33.8  Jean Phillips is currently in the action stage of change. As such, her goal is to continue with weight loss efforts. She has agreed to the Category 3 Plan and keeping a food journal and adhering to recommended goals of 1400-1500 calories and 90+ grams protein.   Exercise goals: All adults should avoid inactivity. Some physical activity is better than none, and adults who participate in any amount of physical activity gain some health benefits.  Behavioral modification strategies: increasing lean protein intake, meal planning and cooking strategies, keeping healthy foods in the home, and planning for success.  Jahleah has agreed to follow-up with our clinic in 2 weeks. She was informed of the importance of frequent follow-up visits to maximize her success with intensive lifestyle modifications for her multiple health conditions.   Objective:  Blood pressure 111/75, pulse 94, temperature 98.4 F (36.9 C), height 5\' 4"  (1.626 m), weight 197 lb (89.4 kg), last menstrual period 07/16/2021, SpO2 99 %. Body mass index is 33.81 kg/m.  General: Cooperative, alert, well  developed, in no acute distress. HEENT: Conjunctivae and lids unremarkable. Cardiovascular: Regular rhythm.  Lungs: Normal work of breathing. Neurologic: No focal deficits.   Lab Results  Component Value Date   CREATININE 0.79 07/08/2021   BUN 15 07/08/2021   NA 139 07/08/2021   K 5.0 07/08/2021   CL 103 07/08/2021   CO2 21 07/08/2021   Lab Results  Component Value Date   ALT 18 07/08/2021   AST 20 07/08/2021   ALKPHOS 77 07/08/2021   BILITOT 0.4 07/08/2021   Lab Results  Component Value Date   HGBA1C 5.4 07/08/2021   HGBA1C 5.1 05/10/2016   Lab Results  Component Value Date   INSULIN 7.2 07/08/2021   Lab Results  Component Value Date   TSH 1.350 07/08/2021   Lab Results  Component Value Date   CHOL 242 (H) 07/08/2021   HDL 49 07/08/2021   LDLCALC 166 (H) 07/08/2021   TRIG 148 07/08/2021   CHOLHDL 4 01/06/2021   Lab Results  Component Value Date   VD25OH 23.7 (L) 07/08/2021   Lab Results  Component Value Date   WBC 7.0 07/08/2021   HGB 14.3 07/08/2021   HCT 43.5 07/08/2021   MCV 87 07/08/2021   PLT 384 07/08/2021    Attestation Statements:   Reviewed by clinician on day of visit: allergies, medications, problem list, medical history, surgical history, family history, social history, and previous encounter notes.  Coral Ceo, CMA, am acting as transcriptionist for Coralie Common, MD.   I have reviewed the above documentation for accuracy and completeness, and I agree with the above. - Coralie Common, MD

## 2021-08-12 ENCOUNTER — Other Ambulatory Visit: Payer: Self-pay

## 2021-08-12 ENCOUNTER — Encounter (INDEPENDENT_AMBULATORY_CARE_PROVIDER_SITE_OTHER): Payer: Self-pay | Admitting: Family Medicine

## 2021-08-12 ENCOUNTER — Ambulatory Visit (INDEPENDENT_AMBULATORY_CARE_PROVIDER_SITE_OTHER): Payer: No Typology Code available for payment source | Admitting: Family Medicine

## 2021-08-12 ENCOUNTER — Other Ambulatory Visit (HOSPITAL_COMMUNITY): Payer: Self-pay

## 2021-08-12 VITALS — BP 114/74 | HR 97 | Temp 98.3°F | Ht 64.0 in | Wt 195.0 lb

## 2021-08-12 DIAGNOSIS — E559 Vitamin D deficiency, unspecified: Secondary | ICD-10-CM

## 2021-08-12 DIAGNOSIS — Z6833 Body mass index (BMI) 33.0-33.9, adult: Secondary | ICD-10-CM

## 2021-08-12 DIAGNOSIS — Z9189 Other specified personal risk factors, not elsewhere classified: Secondary | ICD-10-CM

## 2021-08-12 DIAGNOSIS — E7849 Other hyperlipidemia: Secondary | ICD-10-CM | POA: Diagnosis not present

## 2021-08-12 DIAGNOSIS — E669 Obesity, unspecified: Secondary | ICD-10-CM

## 2021-08-12 MED ORDER — VITAMIN D (ERGOCALCIFEROL) 1.25 MG (50000 UNIT) PO CAPS
50000.0000 [IU] | ORAL_CAPSULE | ORAL | 0 refills | Status: DC
  Filled 2021-08-12: qty 4, 28d supply, fill #0

## 2021-08-13 ENCOUNTER — Other Ambulatory Visit (HOSPITAL_COMMUNITY): Payer: Self-pay

## 2021-08-13 NOTE — Progress Notes (Signed)
Chief Complaint:   OBESITY Jean Phillips is here to discuss her progress with her obesity treatment plan along with follow-up of her obesity related diagnoses. [name] is on keeping a food journal and adhering to recommended goals of 1400-1500 calories and 90+ grams protein and states she is following her eating plan approximately 98% of the time. [name] states she is doing HIIT, elliptical, and weights 30-45 minutes 2-5 times per week.  Today's visit was #: 3 Starting weight: 193 lbs Starting date: 07/08/2021 Today's weight: 195 lbs Today's date: 08/12/2021 Total lbs lost to date: 0 Total lbs lost since last in-office visit: 2  Interim History: Pt felt she did really well on journaling daily. She is ending up at 1200-1300 calories per day and 110+ grams protein per day. She has started exercising a bit. Pt has no upcoming trips or events. She is wondering about incorporating avocado and crackers.  Subjective:   1. Vitamin D deficiency [name] denies nausea, vomiting, and muscle weakness but notes fatigue. She is on prescription Vit D.  2. Other hyperlipidemia [name] has an LDL of 166, HDL 49, and triglycerides 132.  3. At risk for deficient intake of food The patient is at a higher than average risk of deficient intake of food due to inadequate intake.  Assessment/Plan:   1. Vitamin D deficiency Low Vitamin D level contributes to fatigue and are associated with obesity, breast, and colon cancer. She agrees to continue to take prescription Vitamin D 50,000 IU every week and will follow-up for routine testing of Vitamin D, at least 2-3 times per year to avoid over-replacement.  Refill- Vitamin D, Ergocalciferol, (DRISDOL) 1.25 MG (50000 UNIT) CAPS capsule; Take 1 capsule (50,000 Units total) by mouth every 7 (seven) days.  Dispense: 4 capsule; Refill: 0  2. Other hyperlipidemia Cardiovascular risk and specific lipid/LDL goals reviewed.  We discussed several lifestyle modifications today and  [name] will continue to work on diet, exercise and weight loss efforts. Orders and follow up as documented in patient record. Repeat labs in 3 months.  Counseling Intensive lifestyle modifications are the first line treatment for this issue. Dietary changes: Increase soluble fiber. Decrease simple carbohydrates. Exercise changes: Moderate to vigorous-intensity aerobic activity 150 minutes per week if tolerated. Lipid-lowering medications: see documented in medical record.  3. At risk for deficient intake of food [name] was given approximately 15 minutes of deficient intake of food prevention counseling today. [name] is at risk for eating too few calories based on current food recall. She was encouraged to focus on meeting caloric and protein goals according to her recommended meal plan.  4. Obesity with current BMI of 33.6 [name] is currently in the action stage of change. As such, her goal is to continue with weight loss efforts. She has agreed to keeping a food journal and adhering to recommended goals of 1300-1400 calories and 85+ grams protein.   Exercise goals: All adults should avoid inactivity. Some physical activity is better than none, and adults who participate in any amount of physical activity gain some health benefits.  Behavioral modification strategies: increasing lean protein intake, meal planning and cooking strategies, keeping healthy foods in the home, and planning for success.  [name] has agreed to follow-up with our clinic in 2-3 weeks. She was informed of the importance of frequent follow-up visits to maximize her success with intensive lifestyle modifications for her multiple health conditions.   Objective:   Blood pressure 114/74, pulse 97, temperature 98.3 F (36.8 C),  height 5\' 4"  (1.626 m), weight 195 lb (88.5 kg), last menstrual period 07/16/2021, SpO2 97 %. Body mass index is 33.47 kg/m.  General: Cooperative, alert, well developed, in no acute distress. HEENT:  Conjunctivae and lids unremarkable. Cardiovascular: Regular rhythm.  Lungs: Normal work of breathing. Neurologic: No focal deficits.   Lab Results  Component Value Date   CREATININE 0.79 07/08/2021   BUN 15 07/08/2021   NA 139 07/08/2021   K 5.0 07/08/2021   CL 103 07/08/2021   CO2 21 07/08/2021   Lab Results  Component Value Date   ALT 18 07/08/2021   AST 20 07/08/2021   ALKPHOS 77 07/08/2021   BILITOT 0.4 07/08/2021   Lab Results  Component Value Date   HGBA1C 5.4 07/08/2021   HGBA1C 5.1 05/10/2016   Lab Results  Component Value Date   INSULIN 7.2 07/08/2021   Lab Results  Component Value Date   TSH 1.350 07/08/2021   Lab Results  Component Value Date   CHOL 242 (H) 07/08/2021   HDL 49 07/08/2021   LDLCALC 166 (H) 07/08/2021   TRIG 148 07/08/2021   CHOLHDL 4 01/06/2021   Lab Results  Component Value Date   VD25OH 23.7 (L) 07/08/2021   Lab Results  Component Value Date   WBC 7.0 07/08/2021   HGB 14.3 07/08/2021   HCT 43.5 07/08/2021   MCV 87 07/08/2021   PLT 384 07/08/2021   Attestation Statements:   Reviewed by clinician on day of visit: allergies, medications, problem list, medical history, surgical history, family history, social history, and previous encounter notes.  09/05/2021, CMA, am acting as transcriptionist for Edmund Hilda, MD.  I have reviewed the above documentation for accuracy and completeness, and I agree with the above. - Reuben Likes, MD

## 2021-09-04 ENCOUNTER — Other Ambulatory Visit (HOSPITAL_COMMUNITY): Payer: Self-pay

## 2021-09-08 ENCOUNTER — Encounter: Payer: Self-pay | Admitting: *Deleted

## 2021-09-10 ENCOUNTER — Other Ambulatory Visit: Payer: Self-pay

## 2021-09-10 ENCOUNTER — Other Ambulatory Visit (HOSPITAL_COMMUNITY): Payer: Self-pay

## 2021-09-10 ENCOUNTER — Encounter (INDEPENDENT_AMBULATORY_CARE_PROVIDER_SITE_OTHER): Payer: Self-pay | Admitting: Family Medicine

## 2021-09-10 ENCOUNTER — Ambulatory Visit (INDEPENDENT_AMBULATORY_CARE_PROVIDER_SITE_OTHER): Payer: No Typology Code available for payment source | Admitting: Family Medicine

## 2021-09-10 VITALS — BP 112/75 | HR 81 | Temp 98.1°F | Ht 64.0 in | Wt 194.0 lb

## 2021-09-10 DIAGNOSIS — Z9189 Other specified personal risk factors, not elsewhere classified: Secondary | ICD-10-CM | POA: Diagnosis not present

## 2021-09-10 DIAGNOSIS — E559 Vitamin D deficiency, unspecified: Secondary | ICD-10-CM | POA: Diagnosis not present

## 2021-09-10 DIAGNOSIS — E669 Obesity, unspecified: Secondary | ICD-10-CM

## 2021-09-10 DIAGNOSIS — E7849 Other hyperlipidemia: Secondary | ICD-10-CM | POA: Diagnosis not present

## 2021-09-10 DIAGNOSIS — Z6833 Body mass index (BMI) 33.0-33.9, adult: Secondary | ICD-10-CM | POA: Diagnosis not present

## 2021-09-10 MED ORDER — VITAMIN D (ERGOCALCIFEROL) 1.25 MG (50000 UNIT) PO CAPS
50000.0000 [IU] | ORAL_CAPSULE | ORAL | 0 refills | Status: DC
  Filled 2021-09-10: qty 4, 28d supply, fill #0

## 2021-09-13 NOTE — Progress Notes (Signed)
Chief Complaint:   OBESITY Jean Phillips is here to discuss her progress with her obesity treatment plan along with follow-up of her obesity related diagnoses. Jean Phillips is on keeping a food journal and adhering to recommended goals of 1300-1400 calories and 85+ grams of protein daily and states she is following her eating plan approximately 80% of the time. Jean Phillips states she is doing cardio for 30 minutes 1 time per week, HIT for 30 minutes 2 times per week, and weights for 45-60 minutes 3 times per week.  Today's visit was #: 4 Starting weight: 193 lbs Starting date: 07/08/2021 Today's weight: 194 lbs Today's date: 09/10/2021 Total lbs lost to date: 0 Total lbs lost since last in-office visit: 1  Interim History: Jean Phillips has been doing really well hitting between 1400-1500 calories daily. Protein average daily is 130 grams. She is feeling more hungry in the last few days. Using protein shake and protein bar to help get protein in during the day. Incorporating significant protein in meat.  Subjective:   1. Vitamin D deficiency Jean Phillips's last Vit D level was of 23.7. She is on prescription Vit D and she notes fatigue.  2. Other hyperlipidemia Jean Phillips's last LDL was 166, HDL 49, and triglycerides 409. She is not on medications.  3. At risk for osteoporosis Jean Phillips is at higher risk of osteopenia and osteoporosis due to Vitamin D deficiency.   Assessment/Plan:   1. Vitamin D deficiency We will refill prescription Vitamin D 50,000 IU weekly for 1 month. We will repeat labs in 2 months.  - Vitamin D, Ergocalciferol, (DRISDOL) 1.25 MG (50000 UNIT) CAPS capsule; Take 1 capsule (50,000 Units total) by mouth every 7 (seven) days.  Dispense: 4 capsule; Refill: 0  2. Other hyperlipidemia We will repeat labs in May/June 2023.  3. At risk for osteoporosis Jean Phillips was given approximately 15 minutes of osteoporosis prevention counseling today. Jean Phillips is at risk for osteopenia and osteoporosis due to her Vitamin D  deficiency. She was encouraged to take her Vit D and follow her higher calcium diet and increase strengthening exercise to help strengthen her bones and decrease her risk of osteopenia and osteoporosis.  4. Obesity with current BMI of 33.3 Jean Phillips is currently in the action stage of change. As such, her goal is to continue with weight loss efforts. She has agreed to the Category 3 Plan and keeping a food journal and adhering to recommended goals of 1400-1600 calories and 90+ grams of protein daily.   Plan to limit protein supplement of shakes or bars on days she is not working.  Exercise goals: All adults should avoid inactivity. Some physical activity is better than none, and adults who participate in any amount of physical activity gain some health benefits.  Behavioral modification strategies: increasing lean protein intake.  Jean Phillips has agreed to follow-up with our clinic in 3 weeks. She was informed of the importance of frequent follow-up visits to maximize her success with intensive lifestyle modifications for her multiple health conditions.   Objective:   Blood pressure 112/75, pulse 81, temperature 98.1 F (36.7 C), height 5\' 4"  (1.626 m), weight 194 lb (88 kg), last menstrual period 09/10/2021, SpO2 96 %. Body mass index is 33.3 kg/m.  General: Cooperative, alert, well developed, in no acute distress. HEENT: Conjunctivae and lids unremarkable. Cardiovascular: Regular rhythm.  Lungs: Normal work of breathing. Neurologic: No focal deficits.   Lab Results  Component Value Date   CREATININE 0.79 07/08/2021   BUN 15 07/08/2021  NA 139 07/08/2021   K 5.0 07/08/2021   CL 103 07/08/2021   CO2 21 07/08/2021   Lab Results  Component Value Date   ALT 18 07/08/2021   AST 20 07/08/2021   ALKPHOS 77 07/08/2021   BILITOT 0.4 07/08/2021   Lab Results  Component Value Date   HGBA1C 5.4 07/08/2021   HGBA1C 5.1 05/10/2016   Lab Results  Component Value Date   INSULIN 7.2  07/08/2021   Lab Results  Component Value Date   TSH 1.350 07/08/2021   Lab Results  Component Value Date   CHOL 242 (H) 07/08/2021   HDL 49 07/08/2021   LDLCALC 166 (H) 07/08/2021   TRIG 148 07/08/2021   CHOLHDL 4 01/06/2021   Lab Results  Component Value Date   VD25OH 23.7 (L) 07/08/2021   Lab Results  Component Value Date   WBC 7.0 07/08/2021   HGB 14.3 07/08/2021   HCT 43.5 07/08/2021   MCV 87 07/08/2021   PLT 384 07/08/2021   No results found for: IRON, TIBC, FERRITIN  Attestation Statements:   Reviewed by clinician on day of visit: allergies, medications, problem list, medical history, surgical history, family history, social history, and previous encounter notes.   I, Burt Knack, am acting as transcriptionist for Reuben Likes, MD.  I have reviewed the above documentation for accuracy and completeness, and I agree with the above. - Reuben Likes, MD

## 2021-09-23 ENCOUNTER — Telehealth: Payer: Self-pay | Admitting: Neurology

## 2021-09-23 NOTE — Telephone Encounter (Signed)
Pt request refill for Galcanezumab-gnlm [facility]) 120 MG/ML SOAJ at [facility] ? ?Pt scheduled appt with Dr. Jaynee Eagles on 11/11/21 ?

## 2021-09-23 NOTE — Telephone Encounter (Signed)
I'm sorry, a prior authorization is due. We cannot do this for her until she is seen in the office. Pt hasn't been seen since November 2021 and the last refills provided to her were about 5 back in early 2022. Please call her and schedule her with the next available NP.  ?

## 2021-09-25 ENCOUNTER — Other Ambulatory Visit (HOSPITAL_COMMUNITY): Payer: Self-pay

## 2021-09-30 ENCOUNTER — Ambulatory Visit (INDEPENDENT_AMBULATORY_CARE_PROVIDER_SITE_OTHER): Payer: No Typology Code available for payment source | Admitting: Adult Health

## 2021-09-30 ENCOUNTER — Ambulatory Visit (INDEPENDENT_AMBULATORY_CARE_PROVIDER_SITE_OTHER): Payer: No Typology Code available for payment source | Admitting: Family Medicine

## 2021-09-30 ENCOUNTER — Encounter: Payer: Self-pay | Admitting: Adult Health

## 2021-09-30 ENCOUNTER — Encounter (INDEPENDENT_AMBULATORY_CARE_PROVIDER_SITE_OTHER): Payer: Self-pay | Admitting: Family Medicine

## 2021-09-30 ENCOUNTER — Other Ambulatory Visit (HOSPITAL_COMMUNITY): Payer: Self-pay

## 2021-09-30 VITALS — BP 111/67 | HR 80 | Temp 98.7°F | Ht 64.0 in | Wt 195.0 lb

## 2021-09-30 VITALS — BP 117/73 | HR 69 | Ht 64.0 in | Wt 201.0 lb

## 2021-09-30 DIAGNOSIS — G43709 Chronic migraine without aura, not intractable, without status migrainosus: Secondary | ICD-10-CM | POA: Diagnosis not present

## 2021-09-30 DIAGNOSIS — E669 Obesity, unspecified: Secondary | ICD-10-CM

## 2021-09-30 DIAGNOSIS — E7849 Other hyperlipidemia: Secondary | ICD-10-CM | POA: Diagnosis not present

## 2021-09-30 DIAGNOSIS — E559 Vitamin D deficiency, unspecified: Secondary | ICD-10-CM

## 2021-09-30 DIAGNOSIS — Z9189 Other specified personal risk factors, not elsewhere classified: Secondary | ICD-10-CM

## 2021-09-30 DIAGNOSIS — Z6833 Body mass index (BMI) 33.0-33.9, adult: Secondary | ICD-10-CM | POA: Diagnosis not present

## 2021-09-30 MED ORDER — SUMATRIPTAN SUCCINATE 100 MG PO TABS
ORAL_TABLET | ORAL | 11 refills | Status: DC
Start: 2021-09-30 — End: 2022-04-14
  Filled 2021-09-30: qty 9, 30d supply, fill #0
  Filled 2022-02-05: qty 9, 30d supply, fill #1

## 2021-09-30 MED ORDER — WEGOVY 0.25 MG/0.5ML ~~LOC~~ SOAJ
0.2500 mg | SUBCUTANEOUS | 0 refills | Status: DC
  Filled 2021-09-30: qty 2, 28d supply, fill #0

## 2021-09-30 MED ORDER — PROMETHAZINE HCL 25 MG PO TABS
ORAL_TABLET | ORAL | 11 refills | Status: DC
  Filled 2021-09-30: qty 20, 5d supply, fill #0
  Filled 2022-02-05: qty 20, 5d supply, fill #1

## 2021-09-30 NOTE — Progress Notes (Signed)
?[facility] ? ? ? ?Provider:  Dr Jaynee Eagles ?Requesting Provider: Mosie Lukes, MD ?Primary Care Provider:  Mosie Lukes, MD ? ?CC:  Migraines ? ? ?Chief Complaint  ?Patient presents with  ? Follow-up  ?  Rm 3 alone ?Pt is well, here for medication management for migraines   ?  ? ? ?HPI: ? ? ?UPDATE 09/30/2021 JM: Patient returns for chronic migraine headaches.  She was previously seen by Dr. Jaynee Eagles in 05/2020.  Due to insurance requirements, unable to remain on Ajovy and was required to trial Emgality first.  She recently started [facility], has completed 2 injections  and due to next injection soon. She does believe she had greater benefit on Ajovy but is aware she needs to give Emgality some time.  Over the past month, she reports approximately 5 migraines lasting anywhere from 24 to 72 hours.  Use of Imitrex for migrainous headaches, is not beneficial during the day but will take at night in combination with Phenergan and 50% of the time will wake up without residual migraine. She does have occasional mild non migrainous headaches.  ? ? ? ? ?History provided for reference purposes only ?Interval history 06/01/2020 Dr. Jaynee Eagles: Patient with chronic migraines was started on Ajovy. Here for follow up. Baseline was 23 headache days out of the month, and 10 -12 moderately severe to severe migraines a month. Now 10 headache days which includes 5 migraine days. And imitrex helps acutely. Nurtec didn't help. She is extremely happy. ? ?MRI brain 02/13/2020: ? ? IMPRESSION: This MRI of the brain with and without contrast shows the following: ?1.   The brain appears normal before and after contrast. ?2.   Mild maxillary and ethmoid chronic sinusitis. ?3.   Left mastoid effusion.  This could be due to eustachian tube dysfunction. ?4.   There is a normal enhancement pattern no acute findings. ? ?Consult visit 01/28/2020 Dr. Jaynee Eagles: ? Jean Phillips is a 31 y.o. female here as requested by Mosie Lukes, MD for  migraines. She has a PMHx of POTS, migraines. I reviewed Dr. Frederik Pear notes: Patient was last seen for annual preventative exam, this was at the end of June of this year, no recent illnesses, she is feeling well, continue to stay active and maintaining a healthy diet, she discussed migraine headaches with Dr. Charlett Blake who encouraged increased hydration, minimizing alcohol and caffeine, eating small frequent meals with lean proteins and complex carbs and avoiding high and low blood sugars, getting adequate sleep 78 hours a night, and exercising.  Physical examination was normal.  For her migraines it appears she is on Imitrex 100 mg and propranolol 10 mg.  I reviewed TSH, CMP and CBC which were all normal.  I do not see that patient had brain imaging in the notes I have available. ? ?She has had migraines since she was a kid, she reports having them in the fourth grade and that is when they started to be migraines and not just headaches, as she is gotten older they have gotten more frequent and more severe, she is tried multiple medications if is just a headache analgesics little help if it is a migraine over-the-counter meds will not work, she has had a headache since Friday.Patient's migraines start behind te eyes maybe unilateral but either side, behind her ears and the top of the neck, temples, pulsating/pounding and throbbing and sharper, nausea, light and sound sensitivity, they can last 24-72 hours, sumatriptan may help if she sleeps,  she can wake up in the morning with headaches, vision changes and blurry vision, slowly progressive through the years. Over the last year it has worsened and she has 23 headache days out of the month, and 10 -12 moderately severe to severe migraines a month. No medication overuse. No aura. She has tried to narrow down triggers, better when she is exercing but she takes continuous birth control so not menstrual, she tried cutting out caffeine, at work they seem to be worse, blistering  migraine severe, she tries to drink, she tried to eat well. No family history of migraines. No other focal neurologic deficits, associated symptoms, inciting events or modifiable factors. ? ?From a thorough review of records, meds tried that can be used in migraine prevention include: Tylenol, propranolol, Imitrex, ibuprofen, labetalol, metoprolol, Zofran injections and tablets, Percocet, prednisone tablets, Phenergan tablets, rizatriptan, ? ?Reviewed notes, labs and imaging from outside physicians, which showed: see above ? ? ? ?Review of Systems: ?Patient complains of symptoms per HPI as well as the following symptoms: headache . Pertinent negatives and positives per HPI. All others negative ? ? ? ?Social History  ? ?Socioeconomic History  ? Marital status: Married  ?  Spouse name: Not on file  ? Number of children: Not on file  ? Years of education: Not on file  ? Highest education level: Not on file  ?Occupational History  ? Occupation: Equities trader  ?Tobacco Use  ? Smoking status: Former  ? Smokeless tobacco: Never  ?Vaping Use  ? Vaping Use: Never used  ?Substance and Sexual Activity  ? Alcohol use: No  ? Drug use: No  ? Sexual activity: Yes  ?Other Topics Concern  ? Not on file  ?Social History Narrative  ? Lives with husband, son   ? 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 No dietary restrictions.  ? Caffeine: 1 cup coffee in the morning and maybe once a week she has a coke.  ? ?Social Determinants of Health  ? ?Financial Resource Strain: Not on file  ?Food Insecurity: Not on file  ?Transportation Needs: Not on file  ?Physical Activity: Not on file  ?Stress: Not on file  ?Social Connections: Not on file  ?Intimate Partner Violence: Not on file  ? ? ?Family History  ?Problem Relation Age of Onset  ? Hyperlipidemia Mother   ? Diabetes Mother   ? Obesity Mother   ?     s/p gastric bypass  ? Hypertension Mother   ? Depression Mother   ? Obesity Father   ? Hypertension Father   ? Heart disease Father   ?     s/p MI in  2014, MI  ? Alcohol abuse Father   ? Dementia Father   ? Alcoholism Father   ? Hyperthyroidism Sister   ? Thyroid disease Sister   ?     hyper  ? Diabetes Maternal Grandmother   ? Thyroid disease Maternal Grandmother   ?     hypo, hyper  ? Hypertension Maternal Grandmother   ? Diabetes Maternal Grandfather   ? Hypertension Maternal Grandfather   ? Migraines Neg Hx   ? ? ?Past Medical History:  ?Diagnosis Date  ? Anemia   ? Back pain   ? Endometriosis   ? Lactose intolerance   ? Migraines   ? Orthostatic hypotension   ? POTS (postural orthostatic tachycardia syndrome)   ? Subchorionic hematoma   ? ? ?Patient Active Problem List  ? Diagnosis Date Noted  ?  Class 2 obesity with body mass index (BMI) of 36.0 to 36.9 in adult 01/06/2021  ? Insomnia 01/06/2021  ? Depression with anxiety 01/06/2021  ? Sun-damaged skin 01/07/2020  ? Low back pain 04/28/2018  ? Migraine headache 07/08/2017  ? Sinus tachycardia 06/18/2016  ? Preventative health care 05/16/2016  ? Hypoglycemia 05/10/2016  ? POTS (postural orthostatic tachycardia syndrome)   ? Endometriosis   ? ? ?Past Surgical History:  ?Procedure Laterality Date  ? ENDOMETRIAL ABLATION    ? LAPAROSCOPIC OVARIAN CYSTECTOMY    ? ? ?Current Outpatient Medications  ?Medication Sig Dispense Refill  ? FLUoxetine (PROZAC) 20 MG tablet Take 1 tablet (20 mg total) by mouth daily. 90 tablet 1  ? Galcanezumab-gnlm (EMGALITY) 120 MG/ML SOAJ Inject 120 mg into the skin every 30 days. 1 mL 5  ? levonorgestrel-ethinyl estradiol (AVIANE) 0.1-20 MG-MCG tablet Take 1 tablet by mouth every day 84 tablet 3  ? Multiple Vitamin (MULTIVITAMIN PO) Take by mouth daily.    ? promethazine (PHENERGAN) 25 MG tablet TAKE 1/2 - 1 TABLET BY MOUTH EVERY 6 HOURS AS NEEDED FOR NAUSEA OR VOMITING. 20 tablet 1  ? propranolol (INDERAL) 10 MG tablet TAKE 1 TABLET BY MOUTH TWICE DAILY 180 tablet 3  ? Vitamin D, Ergocalciferol, (DRISDOL) 1.25 MG (50000 UNIT) CAPS capsule Take 1 capsule (50,000 Units total) by mouth  every 7 (seven) days. 4 capsule 0  ? SUMAtriptan (IMITREX) 100 MG tablet TAKE 1 TABLET BY MOUTH AS NEEDED FOR MIGRAINE, MAY REPEAT IN 2 HOURS IF HEADACHE PERSISTS OR RECURS 10 tablet 11  ? ?No current fa

## 2021-09-30 NOTE — Patient Instructions (Addendum)
Your Plan: ? ?Continue Emgality monthly injections for migraine prevention ? ?Continue Imitrex as needed for abortive migraine therapy ? ? ? ?Follow-up in 6 months or call earlier if needed ? ? ? ? ?Thank you for coming to see [place] at [facility]. I hope we have been able to provide you high quality care today. ? ?You may receive a patient satisfaction survey over the next few weeks. We would appreciate your feedback and comments so that we may continue to improve ourselves and the health of our patients. ? ?

## 2021-10-01 ENCOUNTER — Other Ambulatory Visit (HOSPITAL_COMMUNITY): Payer: Self-pay

## 2021-10-01 MED ORDER — VITAMIN D (ERGOCALCIFEROL) 1.25 MG (50000 UNIT) PO CAPS
50000.0000 [IU] | ORAL_CAPSULE | ORAL | 0 refills | Status: DC
  Filled 2021-10-01: qty 4, 28d supply, fill #0

## 2021-10-03 ENCOUNTER — Other Ambulatory Visit (HOSPITAL_COMMUNITY): Payer: Self-pay

## 2021-10-05 ENCOUNTER — Other Ambulatory Visit (HOSPITAL_COMMUNITY): Payer: Self-pay

## 2021-10-05 NOTE — Progress Notes (Signed)
? ? ? ?Chief Complaint:  ? ?OBESITY ?Jean Phillips is here to discuss her progress with her obesity treatment plan along with follow-up of her obesity related diagnoses. Darcia is on the Category 3 Plan or keeping a food journal and adhering to recommended goals of 1400-1600 calories and 90+ grams of protein daily and states she is following her eating plan approximately  50 % of the time. Kalista states she is lifting weights and doing cardio 30-45-60 minutes 1-3 times per week. ? ?Today's visit was #: 5 ?Starting weight: 193 lbs ?Starting date: 07/08/2021 ?Today's weight: 165 lbs ?Today's date: 09/30/2021 ?Total lbs lost to date: 0 ?Total lbs lost since last in-office visit: 0 ? ?Interim History[name]. She was 100%on plan prior to her birthday. Then after her birthday she has struggled to get back on plan. Feeling frustrated with lack of weight loss. ? ?Subjective:  ? ?1. Vitamin D deficiency ?Jean Phillips is on prescription Vit D she denies any nausea, vomiting or muscle weakness. Deone notes fatigue. ? ?2. Other hyperlipidemia ?Jean Phillips's last LDL was at 166, HDL was at 49, and Triglycerides at 148. She is not on medications.  ? ?3. At risk for side effect of medication ?Jean Phillips is at risk for drug side effect due to starting a new drug, Wegovy. ? ?Assessment/Plan:  ? ?1. Vitamin D deficiency ?We will refill Vitamin D today for 1 month with no refills. ? ?- Refill Vitamin D, Ergocalciferol, (DRISDOL) 1.25 MG (50000 UNIT) CAPS capsule; Take 1 capsule (50,000 Units total) by mouth every 7 (seven) days.  Dispense: 4 capsule; Refill: 0 ? ?2. Other hyperlipidemia ?Alga will to continue Category 3 or Journaling 1400-1600 cal with 90+ grams of protein. ? ?3. At risk for side effect of medication ?[name] was given approximately 15 minutes of drug side effect counseling today.  We discussed side effect possibility and risk versus benefits. [name] agreed to the medication and will contact  this office if these side effects are intolerable. ? ?Repetitive spaced learning was employed today to elicit superior memory formation and behavioral change. ? ?4. Obesity with current BMI of 33.5 ?Jean Phillips is currently in the action stage of change. As such, her goal is to continue with weight loss efforts. She has agreed to keeping a food journal and adhering to recommended goals of 1450-1600 calories and 90+ grams of protein daily. ? ?We discussed various medication options to help Jean Phillips with her weight loss efforts and we both agreed to start Wegovy 0.25 mg SubQ weekly with no refills. ? ?- Fill Semaglutide-Weight Management (WEGOVY) 0.25 MG/0.5ML SOAJ; Inject 1 pen (0.25 mg) into the skin once a week.  Dispense: 2 mL; Refill: 0 ? ?Exercise goals: All adults should avoid inactivity. Some physical activity is better than none, and adults who participate in any amount of physical activity gain some health benefits. ? ?Behavioral modification strategies: increasing lean protein intake, meal planning and cooking strategies, keeping healthy foods in the home, and planning for success. ? ?Jean Phillips has agreed to follow-up with our clinic in 3 weeks. She was informed of the importance of frequent follow-up visits to maximize her success with intensive lifestyle modifications for her multiple health conditions.  ? ?Objective:  ? ?Blood pressure 111/67, pulse 80, temperature 98.7 ?F (37.1 ?C), height 5\' 4"  (1.626 m), weight 195 lb (88.5 kg), last menstrual period 09/10/2021, SpO2 100 %. ?Body mass index is 33.47 kg/m?. ? ?General: Cooperative, alert, well developed, in  no acute distress. ?HEENT: Conjunctivae and lids unremarkable. ?Cardiovascular: Regular rhythm.  ?Lungs: Normal work of breathing. ?Neurologic: No focal deficits.  ? ?Lab Results  ?Component Value Date  ? CREATININE 0.79 07/08/2021  ? BUN 15 07/08/2021  ? NA 139 07/08/2021  ? K 5.0 07/08/2021  ? CL 103 07/08/2021  ? CO2 21 07/08/2021  ? ?Lab Results  ?Component  Value Date  ? ALT 18 07/08/2021  ? AST 20 07/08/2021  ? ALKPHOS 77 07/08/2021  ? BILITOT 0.4 07/08/2021  ? ?Lab Results  ?Component Value Date  ? HGBA1C 5.4 07/08/2021  ? HGBA1C 5.1 05/10/2016  ? ?Lab Results  ?Component Value Date  ? INSULIN 7.2 07/08/2021  ? ?Lab Results  ?Component Value Date  ? TSH 1.350 07/08/2021  ? ?Lab Results  ?Component Value Date  ? CHOL 242 (H) 07/08/2021  ? HDL 49 07/08/2021  ? LDLCALC 166 (H) 07/08/2021  ? TRIG 148 07/08/2021  ? CHOLHDL 4 01/06/2021  ? ?Lab Results  ?Component Value Date  ? VD25OH 23.7 (L) 07/08/2021  ? ?Lab Results  ?Component Value Date  ? WBC 7.0 07/08/2021  ? HGB 14.3 07/08/2021  ? HCT 43.5 07/08/2021  ? MCV 87 07/08/2021  ? PLT 384 07/08/2021  ? ?No results found for: IRON, TIBC, FERRITIN ? ?Attestation Statements:  ? ?Reviewed by clinician on day of visit: allergies, medications, problem list, medical history, surgical history, family history, social history, and previous encounter notes. ? ?I, Brendell Tyus, am acting as transcriptionist for Reuben Likes, MD. ? ?I have reviewed the above documentation for accuracy and completeness, and I agree with the above. Reuben Likes, MD ? ?

## 2021-10-07 ENCOUNTER — Encounter (INDEPENDENT_AMBULATORY_CARE_PROVIDER_SITE_OTHER): Payer: Self-pay

## 2021-10-07 ENCOUNTER — Telehealth (INDEPENDENT_AMBULATORY_CARE_PROVIDER_SITE_OTHER): Payer: Self-pay | Admitting: Family Medicine

## 2021-10-07 NOTE — Telephone Encounter (Signed)
Prior authorization approved for [facility]. Effective: 10/03/2021 to 01/01/2022. Patient sent approval message via mychart.  ?

## 2021-10-21 ENCOUNTER — Ambulatory Visit (INDEPENDENT_AMBULATORY_CARE_PROVIDER_SITE_OTHER): Payer: No Typology Code available for payment source | Admitting: Family Medicine

## 2021-11-09 ENCOUNTER — Other Ambulatory Visit (HOSPITAL_COMMUNITY): Payer: Self-pay

## 2021-11-11 ENCOUNTER — Ambulatory Visit: Payer: No Typology Code available for payment source | Admitting: Neurology

## 2021-11-11 ENCOUNTER — Telehealth: Payer: Self-pay | Admitting: *Deleted

## 2021-11-11 NOTE — Telephone Encounter (Signed)
Completed Emgality PA on Cover My Meds. Key: [id number]. Awaiting determination from [employer].  ?

## 2021-11-16 ENCOUNTER — Encounter (INDEPENDENT_AMBULATORY_CARE_PROVIDER_SITE_OTHER): Payer: Self-pay | Admitting: Family Medicine

## 2021-11-16 ENCOUNTER — Ambulatory Visit (INDEPENDENT_AMBULATORY_CARE_PROVIDER_SITE_OTHER): Payer: No Typology Code available for payment source | Admitting: Family Medicine

## 2021-11-16 ENCOUNTER — Other Ambulatory Visit (HOSPITAL_COMMUNITY): Payer: Self-pay

## 2021-11-16 VITALS — BP 123/78 | HR 71 | Temp 98.4°F | Ht 64.0 in | Wt 196.0 lb

## 2021-11-16 DIAGNOSIS — E669 Obesity, unspecified: Secondary | ICD-10-CM

## 2021-11-16 DIAGNOSIS — Z6833 Body mass index (BMI) 33.0-33.9, adult: Secondary | ICD-10-CM

## 2021-11-16 DIAGNOSIS — E559 Vitamin D deficiency, unspecified: Secondary | ICD-10-CM | POA: Diagnosis not present

## 2021-11-16 DIAGNOSIS — Z9189 Other specified personal risk factors, not elsewhere classified: Secondary | ICD-10-CM

## 2021-11-16 DIAGNOSIS — Z6379 Other stressful life events affecting family and household: Secondary | ICD-10-CM

## 2021-11-16 MED ORDER — BUSPIRONE HCL 5 MG PO TABS
5.0000 mg | ORAL_TABLET | Freq: Three times a day (TID) | ORAL | 0 refills | Status: DC | PRN
  Filled 2021-11-16: qty 90, 30d supply, fill #0

## 2021-11-16 MED ORDER — VITAMIN D (ERGOCALCIFEROL) 1.25 MG (50000 UNIT) PO CAPS
50000.0000 [IU] | ORAL_CAPSULE | ORAL | 0 refills | Status: DC
  Filled 2021-11-16: qty 4, 28d supply, fill #0

## 2021-11-16 MED ORDER — WEGOVY 0.25 MG/0.5ML ~~LOC~~ SOAJ
0.2500 mg | SUBCUTANEOUS | 0 refills | Status: DC
  Filled 2021-11-16: qty 2, 28d supply, fill #0

## 2021-11-16 NOTE — Telephone Encounter (Signed)
Emaglity approve 11-13-2021 to 12-13-2021.  (1st PA [id number]) then second PA [id number] 12-06-2021 until 05-07-2022 . [employer].   ?

## 2021-11-18 NOTE — Progress Notes (Signed)
Chief Complaint:   OBESITY Jean Phillips is here to discuss her progress with her obesity treatment plan along with follow-up of her obesity related diagnoses. Jean Phillips is on keeping a food journal and adhering to recommended goals of 1450-1600 calories and 90 plus grams of protein and states she is following her eating plan approximately 0% of the time. Jean Phillips states she is doing 0 minutes 0 times per week.  Today's visit was #: 6 Starting weight: 193 lbs Starting date: 07/08/2021 Today's weight: 196 lbs Today's date: 11/16/2021 Total lbs lost to date: 0 Total lbs lost since last in-office visit: 0  Interim History: Jean Phillips has had significantly familial stress with her son being hit by a car. Her son Jean Phillips was in critical care for 1 month but is home now. She wants to get more focused on meal planning now.   Subjective:   1. Vitamin D deficiency Jean Phillips is currently on prescription Vitamin D. Her last Vitamin D level was 23.7.  2. Stressful life event affecting family Jean Phillips is currently on Prozac 20 mg. She has significantly anxiety involving son's traumatic accident.   3. At risk for anxiety Jean Phillips is at risk of developing anxiety due to stress, personal and or family history or current situation.   Assessment/Plan:   1. Vitamin D deficiency Low Vitamin D level contributes to fatigue and are associated with obesity, breast, and colon cancer. We will refill prescription Vitamin D 50,000 IU every week for 1 month with no refills and Francely will follow-up for routine testing of Vitamin D, at least 2-3 times per year to avoid over-replacement.  - Vitamin D, Ergocalciferol, (DRISDOL) 1.25 MG (50000 UNIT) CAPS capsule; Take 1 capsule by mouth every 7 days.  Dispense: 4 capsule; Refill: 0  2. Stressful life event affecting family Jean Phillips will increase Prozac to 40 mg alternating with 20 mg 1 time per week. She agrees to start Buspar 5 mg 3 times daily, as needed for anxiety for 1 month with no  refills. She was encouraged to seek counseling.   - busPIRone (BUSPAR) 5 MG tablet; Take 1 tablet by mouth 3 times daily as needed (anxiety).  Dispense: 90 tablet; Refill: 0  3. At risk for anxiety Jean Phillips was given approximately 15 minutes of anxiety risk counseling today. We discussed the importance of a healthy work life balance, a healthy relationship with food and a good support system.  Repetitive spaced learning was employed today to elicit superior memory formation and behavioral change.   4. Obesity with current BMI of 33.6 Jean Phillips is currently in the action stage of change. As such, her goal is to continue with weight loss efforts. She has agreed to keeping a food journal and adhering to recommended goals of 1450-1600 calories and 90 plus grams of protein daily.    We will refill Wegovy 0.25 mg subcutaneous weekly for 1 month with no refills.   - Semaglutide-Weight Management (WEGOVY) 0.25 MG/0.5ML SOAJ; Inject 1 pen (0.25 mg) into the skin once a week.  Dispense: 2 mL; Refill: 0  Exercise goals: No exercise has been prescribed at this time.  Behavioral modification strategies: increasing lean protein intake, meal planning and cooking strategies, planning for success, and keeping a strict food journal.  Christon has agreed to follow-up with our clinic in 2-3 weeks. She was informed of the importance of frequent follow-up visits to maximize her success with intensive lifestyle modifications for her multiple health conditions.   Objective:   Blood pressure  123/78, pulse 71, temperature 98.4 F (36.9 C), height 5\' 4"  (1.626 m), weight 196 lb (88.9 kg), last menstrual period 11/02/2021, SpO2 99 %. Body mass index is 33.64 kg/m.  General: Cooperative, alert, well developed, in no acute distress. HEENT: Conjunctivae and lids unremarkable. Cardiovascular: Regular rhythm.  Lungs: Normal work of breathing. Neurologic: No focal deficits.   Lab Results  Component Value Date   CREATININE  0.79 07/08/2021   BUN 15 07/08/2021   NA 139 07/08/2021   K 5.0 07/08/2021   CL 103 07/08/2021   CO2 21 07/08/2021   Lab Results  Component Value Date   ALT 18 07/08/2021   AST 20 07/08/2021   ALKPHOS 77 07/08/2021   BILITOT 0.4 07/08/2021   Lab Results  Component Value Date   HGBA1C 5.4 07/08/2021   HGBA1C 5.1 05/10/2016   Lab Results  Component Value Date   INSULIN 7.2 07/08/2021   Lab Results  Component Value Date   TSH 1.350 07/08/2021   Lab Results  Component Value Date   CHOL 242 (H) 07/08/2021   HDL 49 07/08/2021   LDLCALC 166 (H) 07/08/2021   TRIG 148 07/08/2021   CHOLHDL 4 01/06/2021   Lab Results  Component Value Date   VD25OH 23.7 (L) 07/08/2021   Lab Results  Component Value Date   WBC 7.0 07/08/2021   HGB 14.3 07/08/2021   HCT 43.5 07/08/2021   MCV 87 07/08/2021   PLT 384 07/08/2021   No results found for: IRON, TIBC, FERRITIN  Attestation Statements:   Reviewed by clinician on day of visit: allergies, medications, problem list, medical history, surgical history, family history, social history, and previous encounter notes.  I, Lizbeth Bark, RMA, am acting as transcriptionist for Coralie Common, MD.   I have reviewed the above documentation for accuracy and completeness, and I agree with the above. - Coralie Common, MD

## 2021-12-03 ENCOUNTER — Ambulatory Visit (INDEPENDENT_AMBULATORY_CARE_PROVIDER_SITE_OTHER): Payer: No Typology Code available for payment source | Admitting: Family Medicine

## 2021-12-03 ENCOUNTER — Other Ambulatory Visit (HOSPITAL_COMMUNITY): Payer: Self-pay

## 2021-12-03 ENCOUNTER — Encounter (INDEPENDENT_AMBULATORY_CARE_PROVIDER_SITE_OTHER): Payer: Self-pay | Admitting: Family Medicine

## 2021-12-03 VITALS — BP 105/71 | HR 83 | Temp 97.6°F | Ht 64.0 in | Wt 196.0 lb

## 2021-12-03 DIAGNOSIS — E7849 Other hyperlipidemia: Secondary | ICD-10-CM

## 2021-12-03 DIAGNOSIS — E669 Obesity, unspecified: Secondary | ICD-10-CM | POA: Diagnosis not present

## 2021-12-03 DIAGNOSIS — E559 Vitamin D deficiency, unspecified: Secondary | ICD-10-CM

## 2021-12-03 DIAGNOSIS — Z6833 Body mass index (BMI) 33.0-33.9, adult: Secondary | ICD-10-CM | POA: Diagnosis not present

## 2021-12-03 MED ORDER — SAXENDA 18 MG/3ML ~~LOC~~ SOPN
0.6000 mg | PEN_INJECTOR | Freq: Every day | SUBCUTANEOUS | 0 refills | Status: DC
  Filled 2021-12-03 – 2021-12-14 (×2): qty 3, 30d supply, fill #0

## 2021-12-03 MED ORDER — VITAMIN D (ERGOCALCIFEROL) 1.25 MG (50000 UNIT) PO CAPS
50000.0000 [IU] | ORAL_CAPSULE | ORAL | 0 refills | Status: DC
  Filled 2021-12-03: qty 4, 28d supply, fill #0

## 2021-12-03 MED ORDER — UNIFINE PENTIPS 32G X 4 MM MISC
1.0000 | Freq: Two times a day (BID) | 0 refills | Status: DC
  Filled 2021-12-03: qty 100, 50d supply, fill #0

## 2021-12-09 ENCOUNTER — Encounter (INDEPENDENT_AMBULATORY_CARE_PROVIDER_SITE_OTHER): Payer: Self-pay

## 2021-12-09 ENCOUNTER — Telehealth (INDEPENDENT_AMBULATORY_CARE_PROVIDER_SITE_OTHER): Payer: Self-pay | Admitting: Family Medicine

## 2021-12-09 NOTE — Telephone Encounter (Signed)
Dr. Lawson Radar - Prior authorization approved for [employer]. Effective: 12/08/2021 to 03/30/2022. Patient sent approval message via mychart.

## 2021-12-09 NOTE — Progress Notes (Signed)
Chief Complaint:   OBESITY Jean Phillips is here to discuss her progress with her obesity treatment plan along with follow-up of her obesity related diagnoses. Jean Phillips is on keeping a food journal and adhering to recommended goals of 1450-1600 calories and 90+ grams of protein and states she is following her eating plan approximately 30% of the time. Jean Phillips states she is exercising 0 minutes 0 times per week.  Today's visit was #: 7 Starting weight: 193 lbs Starting date: 07/08/2021 Today's weight: 196 lbs Today's date: 12/03/2021 Total lbs lost to date: 3 lbs Total lbs lost since last in-office visit: 0  Interim History: Jean Phillips has been figuring out her life again. She has been consistent on plan for 4 days. She has been at 1500 calories for the last 3 days and 120g of protein. She has felt much better than she did at last appointment.  Subjective:   1. Vitamin D deficiency Jean Phillips is currently taking prescription Vit D 50,000 IU once a week. Her last Vit D level of 23.7.  2. Other hyperlipidemia Jean Phillips is not on any medication. Her last LDL of 166, HDL of 49, Trigly of 148.  Assessment/Plan:   1. Vitamin D deficiency We will refill Vit D 50,000 IU once a week for 1 month with 0 refills.  -Refill Vitamin D, Ergocalciferol, (DRISDOL) 1.25 MG (50000 UNIT) CAPS capsule; Take 1 capsule by mouth every 7 days.  Dispense: 4 capsule; Refill: 0  2. Other hyperlipidemia Jean Phillips will continue journaling. We will obtain labs in 1 month.  3. Obesity with current BMI of 33.7 START Saxenda 0.6 mg for 1 week then INCREASE to 0.9 mg for 1 week then INCREASE to 1.2 mg subcutaneous once a week until next appointment.  -START Liraglutide -Weight Management (SAXENDA) 18 MG/3ML SOPN; Inject 0.6 mg into the skin daily.  Dispense: 3 mL; Refill: 0  -Fill Insulin Pen Needle (UNIFINE PENTIPS) 32G X 4 MM MISC; Use as directed twice daily  Dispense: 100 each; Refill: 0  Jean Phillips is currently in the action stage of change.  As such, her goal is to continue with weight loss efforts. She has agreed to keeping a food journal and adhering to recommended goals of 1450-1600 calories and 90+ grams of protein.   Exercise goals: All adults should avoid inactivity. Some physical activity is better than none, and adults who participate in any amount of physical activity gain some health benefits.  Behavioral modification strategies: increasing lean protein intake, meal planning and cooking strategies, keeping healthy foods in the home, and planning for success.  Jean Phillips has agreed to follow-up with our clinic in 3 weeks. She was informed of the importance of frequent follow-up visits to maximize her success with intensive lifestyle modifications for her multiple health conditions.   Objective:   Blood pressure 105/71, pulse 83, temperature 97.6 F (36.4 C), height 5\' 4"  (1.626 m), weight 196 lb (88.9 kg), SpO2 98 %. Body mass index is 33.64 kg/m.  General: Cooperative, alert, well developed, in no acute distress. HEENT: Conjunctivae and lids unremarkable. Cardiovascular: Regular rhythm.  Lungs: Normal work of breathing. Neurologic: No focal deficits.   Lab Results  Component Value Date   CREATININE 0.79 07/08/2021   BUN 15 07/08/2021   NA 139 07/08/2021   K 5.0 07/08/2021   CL 103 07/08/2021   CO2 21 07/08/2021   Lab Results  Component Value Date   ALT 18 07/08/2021   AST 20 07/08/2021   ALKPHOS 77 07/08/2021  BILITOT 0.4 07/08/2021   Lab Results  Component Value Date   HGBA1C 5.4 07/08/2021   HGBA1C 5.1 05/10/2016   Lab Results  Component Value Date   INSULIN 7.2 07/08/2021   Lab Results  Component Value Date   TSH 1.350 07/08/2021   Lab Results  Component Value Date   CHOL 242 (H) 07/08/2021   HDL 49 07/08/2021   LDLCALC 166 (H) 07/08/2021   TRIG 148 07/08/2021   CHOLHDL 4 01/06/2021   Lab Results  Component Value Date   VD25OH 23.7 (L) 07/08/2021   Lab Results  Component Value Date    WBC 7.0 07/08/2021   HGB 14.3 07/08/2021   HCT 43.5 07/08/2021   MCV 87 07/08/2021   PLT 384 07/08/2021   No results found for: IRON, TIBC, FERRITIN  Attestation Statements:   Reviewed by clinician on day of visit: allergies, medications, problem list, medical history, surgical history, family history, social history, and previous encounter notes.  I, Fortino Sic, RMA am acting as transcriptionist for Reuben Likes, MD.  I have reviewed the above documentation for accuracy and completeness, and I agree with the above. - Reuben Likes, MD

## 2021-12-12 ENCOUNTER — Other Ambulatory Visit (HOSPITAL_COMMUNITY): Payer: Self-pay

## 2021-12-14 ENCOUNTER — Other Ambulatory Visit (HOSPITAL_COMMUNITY): Payer: Self-pay

## 2021-12-22 ENCOUNTER — Other Ambulatory Visit: Payer: Self-pay | Admitting: Family Medicine

## 2021-12-22 ENCOUNTER — Ambulatory Visit (INDEPENDENT_AMBULATORY_CARE_PROVIDER_SITE_OTHER): Payer: No Typology Code available for payment source | Admitting: Family Medicine

## 2021-12-22 ENCOUNTER — Other Ambulatory Visit: Payer: Self-pay | Admitting: Neurology

## 2021-12-22 ENCOUNTER — Encounter (INDEPENDENT_AMBULATORY_CARE_PROVIDER_SITE_OTHER): Payer: Self-pay | Admitting: Family Medicine

## 2021-12-22 ENCOUNTER — Other Ambulatory Visit (HOSPITAL_COMMUNITY): Payer: Self-pay

## 2021-12-22 VITALS — BP 103/69 | HR 56 | Temp 98.2°F | Ht 64.0 in | Wt 191.0 lb

## 2021-12-22 DIAGNOSIS — E559 Vitamin D deficiency, unspecified: Secondary | ICD-10-CM | POA: Diagnosis not present

## 2021-12-22 DIAGNOSIS — E669 Obesity, unspecified: Secondary | ICD-10-CM | POA: Diagnosis not present

## 2021-12-22 DIAGNOSIS — Z6832 Body mass index (BMI) 32.0-32.9, adult: Secondary | ICD-10-CM

## 2021-12-22 DIAGNOSIS — E7849 Other hyperlipidemia: Secondary | ICD-10-CM

## 2021-12-22 MED ORDER — VITAMIN D (ERGOCALCIFEROL) 1.25 MG (50000 UNIT) PO CAPS
50000.0000 [IU] | ORAL_CAPSULE | ORAL | 0 refills | Status: DC
  Filled 2021-12-22 – 2022-02-05 (×2): qty 4, 28d supply, fill #0

## 2021-12-22 MED ORDER — FLUOXETINE HCL 20 MG PO TABS
20.0000 mg | ORAL_TABLET | Freq: Every day | ORAL | 0 refills | Status: DC
  Filled 2021-12-22: qty 30, 30d supply, fill #0

## 2021-12-23 ENCOUNTER — Telehealth: Payer: Self-pay | Admitting: Family Medicine

## 2021-12-23 ENCOUNTER — Other Ambulatory Visit (HOSPITAL_COMMUNITY): Payer: Self-pay

## 2021-12-23 DIAGNOSIS — M545 Low back pain, unspecified: Secondary | ICD-10-CM

## 2021-12-23 MED ORDER — EMGALITY 120 MG/ML ~~LOC~~ SOAJ
120.0000 mg | SUBCUTANEOUS | 5 refills | Status: DC
  Filled 2021-12-23: qty 1, 30d supply, fill #0
  Filled 2022-02-05: qty 1, 30d supply, fill #1

## 2021-12-23 NOTE — Telephone Encounter (Signed)
Referral entered for [facility] per pt request.

## 2021-12-23 NOTE — Telephone Encounter (Signed)
Patient would like to know if Jean Phillips could write a referral for a chiropractor without having to see her. Please advise.

## 2021-12-24 ENCOUNTER — Other Ambulatory Visit (HOSPITAL_COMMUNITY): Payer: Self-pay

## 2021-12-24 NOTE — Progress Notes (Unsigned)
Chief Complaint:   OBESITY Jean Phillips is here to discuss her progress with her obesity treatment plan along with follow-up of her obesity related diagnoses. Jean Phillips is on keeping a food journal and adhering to recommended goals of 1450-1600 calories and 90+ grams of protein and states she is following her eating plan approximately 100% of the time. Jean Phillips states she is weight lifting/walking/elliptical 45 minutes 7 times per week.  Today's visit was #: 8 Starting weight: 193 lbs Starting date: 07/08/2021 Today's weight: 191 lbs Today's date: 12/22/2021 Total lbs lost to date: 2 lbs Total lbs lost since last in-office visit: 5  Interim History: Jean Phillips has been consistently tracking and aiming for her calorie and protein goals. She went to her brothers house but brought all snacks with her. Likely will stay here for July 4th. She has not started [place] but thinks she may hold off on it.  Subjective:   1. Vitamin D deficiency Jean Phillips is currently taking prescription Vit D 50,000 IU once a week. Denies any nausea, vomiting or muscle weakness. She notes fatigue.  2. Other hyperlipidemia Jean Phillips's last LDL was elevated, HDL ay 49.  Assessment/Plan:   1. Vitamin D deficiency We will refill Vit D 50,000 IU once a week for 1 month with 0 refills.   - Refill Vitamin D, Ergocalciferol, (DRISDOL) 1.25 MG (50000 UNIT) CAPS capsule; Take 1 capsule by mouth every 7 days.  Dispense: 4 capsule; Refill: 0  2. Other hyperlipidemia Will obtain labs in Aug.  3. Obesity with current BMI of 32.8 Jean Phillips is currently in the action stage of change. As such, her goal is to continue with weight loss efforts. She has agreed to keeping a food journal and adhering to recommended goals of 1450-1600 calories and 90+ grams of protein daily.  Exercise goals: As is.  Behavioral modification strategies: increasing lean protein intake, meal planning and cooking strategies, planning for success, and keeping a strict food  journal.  Jean Phillips has agreed to follow-up with our clinic in 4 weeks. She was informed of the importance of frequent follow-up visits to maximize her success with intensive lifestyle modifications for her multiple health conditions.   Objective:   Blood pressure 103/69, pulse (!) 56, temperature 98.2 F (36.8 C), height 5\' 4"  (1.626 m), weight 191 lb (86.6 kg), SpO2 98 %. Body mass index is 32.79 kg/m.  General: Cooperative, alert, well developed, in no acute distress. HEENT: Conjunctivae and lids unremarkable. Cardiovascular: Regular rhythm.  Lungs: Normal work of breathing. Neurologic: No focal deficits.   Lab Results  Component Value Date   CREATININE 0.79 07/08/2021   BUN 15 07/08/2021   NA 139 07/08/2021   K 5.0 07/08/2021   CL 103 07/08/2021   CO2 21 07/08/2021   Lab Results  Component Value Date   ALT 18 07/08/2021   AST 20 07/08/2021   ALKPHOS 77 07/08/2021   BILITOT 0.4 07/08/2021   Lab Results  Component Value Date   HGBA1C 5.4 07/08/2021   HGBA1C 5.1 05/10/2016   Lab Results  Component Value Date   INSULIN 7.2 07/08/2021   Lab Results  Component Value Date   TSH 1.350 07/08/2021   Lab Results  Component Value Date   CHOL 242 (H) 07/08/2021   HDL 49 07/08/2021   LDLCALC 166 (H) 07/08/2021   TRIG 148 07/08/2021   CHOLHDL 4 01/06/2021   Lab Results  Component Value Date   VD25OH 23.7 (L) 07/08/2021   Lab Results  Component  Value Date   WBC 7.0 07/08/2021   HGB 14.3 07/08/2021   HCT 43.5 07/08/2021   MCV 87 07/08/2021   PLT 384 07/08/2021   No results found for: "IRON", "TIBC", "FERRITIN"  Attestation Statements:   Reviewed by clinician on day of visit: allergies, medications, problem list, medical history, surgical history, family history, social history, and previous encounter notes.  I, Fortino Sic, RMA am acting as transcriptionist for Reuben Likes, MD.  I have reviewed the above documentation for accuracy and completeness, and  I agree with the above. -  ***

## 2021-12-25 ENCOUNTER — Other Ambulatory Visit (HOSPITAL_COMMUNITY): Payer: Self-pay

## 2021-12-29 ENCOUNTER — Other Ambulatory Visit (HOSPITAL_COMMUNITY): Payer: Self-pay

## 2021-12-30 ENCOUNTER — Other Ambulatory Visit (HOSPITAL_COMMUNITY): Payer: Self-pay

## 2021-12-31 ENCOUNTER — Other Ambulatory Visit (HOSPITAL_COMMUNITY): Payer: Self-pay

## 2022-01-19 ENCOUNTER — Ambulatory Visit (INDEPENDENT_AMBULATORY_CARE_PROVIDER_SITE_OTHER): Payer: No Typology Code available for payment source | Admitting: Family Medicine

## 2022-02-05 ENCOUNTER — Other Ambulatory Visit (HOSPITAL_COMMUNITY): Payer: Self-pay

## 2022-02-09 ENCOUNTER — Other Ambulatory Visit (HOSPITAL_COMMUNITY): Payer: Self-pay

## 2022-02-09 ENCOUNTER — Ambulatory Visit (INDEPENDENT_AMBULATORY_CARE_PROVIDER_SITE_OTHER): Payer: No Typology Code available for payment source | Admitting: Family Medicine

## 2022-02-09 ENCOUNTER — Encounter (INDEPENDENT_AMBULATORY_CARE_PROVIDER_SITE_OTHER): Payer: Self-pay | Admitting: Family Medicine

## 2022-02-09 VITALS — BP 116/77 | HR 87 | Temp 97.8°F | Ht 64.0 in | Wt 203.0 lb

## 2022-02-09 DIAGNOSIS — E7849 Other hyperlipidemia: Secondary | ICD-10-CM | POA: Diagnosis not present

## 2022-02-09 DIAGNOSIS — E559 Vitamin D deficiency, unspecified: Secondary | ICD-10-CM

## 2022-02-09 DIAGNOSIS — E669 Obesity, unspecified: Secondary | ICD-10-CM | POA: Diagnosis not present

## 2022-02-09 DIAGNOSIS — Z6834 Body mass index (BMI) 34.0-34.9, adult: Secondary | ICD-10-CM | POA: Diagnosis not present

## 2022-02-09 MED ORDER — LEVONORGESTREL-ETHINYL ESTRAD 0.1-20 MG-MCG PO TABS
1.0000 | ORAL_TABLET | Freq: Every day | ORAL | 0 refills | Status: DC
  Filled 2022-02-09: qty 84, 84d supply, fill #0

## 2022-02-09 MED ORDER — VITAMIN D (ERGOCALCIFEROL) 1.25 MG (50000 UNIT) PO CAPS
50000.0000 [IU] | ORAL_CAPSULE | ORAL | 0 refills | Status: DC
  Filled 2022-02-09 – 2022-03-04 (×2): qty 4, 28d supply, fill #0

## 2022-02-10 ENCOUNTER — Encounter (INDEPENDENT_AMBULATORY_CARE_PROVIDER_SITE_OTHER): Payer: Self-pay

## 2022-02-11 NOTE — Progress Notes (Signed)
Chief Complaint:   OBESITY Jean Phillips is here to discuss her progress with her obesity treatment plan along with follow-up of her obesity related diagnoses. Jean Phillips is on keeping a food journal and adhering to recommended goals of 1450-1600 calories and 90+ grams of protein and states she is following her eating plan approximately 25% of the time. Jean Phillips states she is exercising 0 minutes 0 times per week.  Today's visit was #: 9 Starting weight: 193 lbs Starting date: 07/08/2021 Today's weight: 203 lbs Today's date: 02/09/2022 Total lbs lost to date: 0 lbs Total lbs lost since last in-office visit: 0  Interim History: Katera has had a really great month of family activities enjoying end of summer. Next few weeks until school starts planning to stay home and refocus on nutrition and activity. She is starting work this week. She plans to get back to journaling.  Subjective:   1. Vitamin D deficiency Jean Phillips is currently on prescription Vit D. Her last Vit D level of 23.7.  2. Other hyperlipidemia Jean Phillips's last LDL at 166, Trigly at 168, and HDL at 49.  Assessment/Plan:   1. Vitamin D deficiency We will refill Vit D 50,000 IU once a week for 1 month with 0 refills. Will obtain labs at next appointment.  -Refill Vitamin D, Ergocalciferol, (DRISDOL) 1.25 MG (50000 UNIT) CAPS capsule; Take 1 capsule by mouth every 7 days.  Dispense: 4 capsule; Refill: 0  2. Other hyperlipidemia Will obtain labs at next appointment.  3. Obesity with current BMI of 34.8 Jean Phillips is currently in the action stage of change. As such, her goal is to continue with weight loss efforts. She has agreed to keeping a food journal and adhering to recommended goals of 1450-1600 calories and 90+ grams of protein daily.   Exercise goals: All adults should avoid inactivity. Some physical activity is better than none, and adults who participate in any amount of physical activity gain some health benefits.  Behavioral  modification strategies: increasing lean protein intake, meal planning and cooking strategies, keeping healthy foods in the home, and planning for success.  Jean Phillips has agreed to follow-up with our clinic in 4 weeks. She was informed of the importance of frequent follow-up visits to maximize her success with intensive lifestyle modifications for her multiple health conditions.   Objective:   Blood pressure 116/77, pulse 87, temperature 97.8 F (36.6 C), height 5\' 4"  (1.626 m), weight 203 lb (92.1 kg), SpO2 97 %. Body mass index is 34.84 kg/m.  General: Cooperative, alert, well developed, in no acute distress. HEENT: Conjunctivae and lids unremarkable. Cardiovascular: Regular rhythm.  Lungs: Normal work of breathing. Neurologic: No focal deficits.   Lab Results  Component Value Date   CREATININE 0.79 07/08/2021   BUN 15 07/08/2021   NA 139 07/08/2021   K 5.0 07/08/2021   CL 103 07/08/2021   CO2 21 07/08/2021   Lab Results  Component Value Date   ALT 18 07/08/2021   AST 20 07/08/2021   ALKPHOS 77 07/08/2021   BILITOT 0.4 07/08/2021   Lab Results  Component Value Date   HGBA1C 5.4 07/08/2021   HGBA1C 5.1 05/10/2016   Lab Results  Component Value Date   INSULIN 7.2 07/08/2021   Lab Results  Component Value Date   TSH 1.350 07/08/2021   Lab Results  Component Value Date   CHOL 242 (H) 07/08/2021   HDL 49 07/08/2021   LDLCALC 166 (H) 07/08/2021   TRIG 148 07/08/2021  CHOLHDL 4 01/06/2021   Lab Results  Component Value Date   VD25OH 23.7 (L) 07/08/2021   Lab Results  Component Value Date   WBC 7.0 07/08/2021   HGB 14.3 07/08/2021   HCT 43.5 07/08/2021   MCV 87 07/08/2021   PLT 384 07/08/2021   No results found for: "IRON", "TIBC", "FERRITIN"  Attestation Statements:   Reviewed by clinician on day of visit: allergies, medications, problem list, medical history, surgical history, family history, social history, and previous encounter notes.  I, Fortino Sic, RMA am acting as transcriptionist for Reuben Likes, MD. I have reviewed the above documentation for accuracy and completeness, and I agree with the above. - Reuben Likes, MD

## 2022-03-03 ENCOUNTER — Ambulatory Visit (INDEPENDENT_AMBULATORY_CARE_PROVIDER_SITE_OTHER): Payer: No Typology Code available for payment source | Admitting: Family Medicine

## 2022-03-03 ENCOUNTER — Other Ambulatory Visit (HOSPITAL_COMMUNITY): Payer: Self-pay

## 2022-03-03 ENCOUNTER — Encounter (INDEPENDENT_AMBULATORY_CARE_PROVIDER_SITE_OTHER): Payer: Self-pay | Admitting: Family Medicine

## 2022-03-03 VITALS — BP 101/67 | HR 90 | Temp 98.6°F | Ht 64.0 in | Wt 200.0 lb

## 2022-03-03 DIAGNOSIS — E559 Vitamin D deficiency, unspecified: Secondary | ICD-10-CM

## 2022-03-03 DIAGNOSIS — E669 Obesity, unspecified: Secondary | ICD-10-CM

## 2022-03-03 DIAGNOSIS — E7849 Other hyperlipidemia: Secondary | ICD-10-CM | POA: Diagnosis not present

## 2022-03-03 DIAGNOSIS — E8881 Metabolic syndrome: Secondary | ICD-10-CM

## 2022-03-03 DIAGNOSIS — Z6834 Body mass index (BMI) 34.0-34.9, adult: Secondary | ICD-10-CM

## 2022-03-03 MED ORDER — SAXENDA 18 MG/3ML ~~LOC~~ SOPN
3.0000 mg | PEN_INJECTOR | Freq: Every day | SUBCUTANEOUS | 0 refills | Status: DC
  Filled 2022-03-03: qty 15, 30d supply, fill #0

## 2022-03-04 ENCOUNTER — Other Ambulatory Visit (HOSPITAL_COMMUNITY): Payer: Self-pay

## 2022-03-04 LAB — COMPREHENSIVE METABOLIC PANEL
ALT: 15 IU/L (ref 0–32)
AST: 18 IU/L (ref 0–40)
Albumin/Globulin Ratio: 1.8 (ref 1.2–2.2)
Albumin: 4.5 g/dL (ref 4.0–5.0)
Alkaline Phosphatase: 61 IU/L (ref 44–121)
BUN/Creatinine Ratio: 13 (ref 9–23)
BUN: 10 mg/dL (ref 6–20)
Bilirubin Total: 0.5 mg/dL (ref 0.0–1.2)
CO2: 21 mmol/L (ref 20–29)
Calcium: 9.6 mg/dL (ref 8.7–10.2)
Chloride: 102 mmol/L (ref 96–106)
Creatinine, Ser: 0.78 mg/dL (ref 0.57–1.00)
Globulin, Total: 2.5 g/dL (ref 1.5–4.5)
Glucose: 74 mg/dL (ref 70–99)
Potassium: 4.5 mmol/L (ref 3.5–5.2)
Sodium: 138 mmol/L (ref 134–144)
Total Protein: 7 g/dL (ref 6.0–8.5)
eGFR: 105 mL/min/{1.73_m2} (ref >59)

## 2022-03-04 LAB — LIPID PANEL WITH LDL/HDL RATIO
Cholesterol, Total: 251 mg/dL — ABNORMAL HIGH (ref 100–199)
HDL: 56 mg/dL (ref >39)
LDL Chol Calc (NIH): 170 mg/dL — ABNORMAL HIGH (ref 0–99)
LDL/HDL Ratio: 3 ratio (ref 0.0–3.2)
Triglycerides: 141 mg/dL (ref 0–149)
VLDL Cholesterol Cal: 25 mg/dL (ref 5–40)

## 2022-03-04 LAB — HEMOGLOBIN A1C
Est. average glucose Bld gHb Est-mCnc: 105 mg/dL
Hgb A1c MFr Bld: 5.3 % (ref 4.8–5.6)

## 2022-03-04 LAB — INSULIN, RANDOM: INSULIN: 7.6 u[IU]/mL (ref 2.6–24.9)

## 2022-03-04 LAB — VITAMIN D 25 HYDROXY (VIT D DEFICIENCY, FRACTURES): Vit D, 25-Hydroxy: 39.8 ng/mL (ref 30.0–100.0)

## 2022-03-09 ENCOUNTER — Other Ambulatory Visit (HOSPITAL_COMMUNITY): Payer: Self-pay

## 2022-03-09 MED ORDER — VITAMIN D (ERGOCALCIFEROL) 1.25 MG (50000 UNIT) PO CAPS
50000.0000 [IU] | ORAL_CAPSULE | ORAL | 0 refills | Status: DC
  Filled 2022-03-09: qty 4, 28d supply, fill #0

## 2022-03-09 NOTE — Progress Notes (Signed)
Chief Complaint:   OBESITY Jean Phillips is here to discuss her progress with her obesity treatment plan along with follow-up of her obesity related diagnoses. Jean Phillips is on keeping a food journal and adhering to recommended goals of 1450-1600 calories and 90+ grams of protein and states she is following her eating plan approximately 50% of the time. Jean Phillips states she is weights 30 minutes 3-4 times per week.  Today's visit was #: 10 Starting weight: 193 lbs Starting date: 07/08/2021 Today's weight: 200 lbs Today's date: 03/03/2022 Total lbs lost to date: 7 lbs Total lbs lost since last in-office visit: 3  Interim History: Jean Phillips's son went back to school over the last few weeks and she went back to work. She has been journaling at 50% of time. Days she journals and stays mindful, she is able to stay within goals for protein and calories. She is working over [employer] Day weekend.  Subjective:   1. Vitamin D deficiency Jean Phillips's last Vit D level of 23.7. Denies any nausea, vomiting or muscle weakness. She notes fatigue.  2. Other hyperlipidemia Jean Phillips's last LDL 166, HDL 49, Trgly 148. She is not on medication.  3. Insulin resistance Jean Phillips's last A1c was 5.4, insulin was 7.2. She is going to start GLP-1.  Assessment/Plan:   1. Vitamin D deficiency We will obtain labs today. We will refill Vit D 50k IU once weekly for 1 month with 0 refills.  - VITAMIN D 25 Hydroxy (Vit-D Deficiency, Fractures)  -Refill Vitamin D, Ergocalciferol, (DRISDOL) 1.25 MG (50000 UNIT) CAPS capsule; Take 1 capsule by mouth every 7 days.  Dispense: 4 capsule; Refill: 0  2. Other hyperlipidemia We will obtain labs today.  - Lipid Panel With LDL/HDL Ratio  3. Insulin resistance We will obtain labs today.  - Comprehensive metabolic panel - Hemoglobin A1c - Insulin, random  4. Obesity with current BMI of 34.5 We will refill Saxenda 3 mg SubQ daily for 1 month with 0 refills  -Refill Liraglutide -Weight  Management (SAXENDA) 18 MG/3ML SOPN; Inject 3 mg into the skin daily.  Dispense: 15 mL; Refill: 0  Jean Phillips is currently in the action stage of change. As such, her goal is to continue with weight loss efforts. She has agreed to keeping a food journal and adhering to recommended goals of 1450-1600 calories and 90+ grams of protein daily.   Exercise goals: All adults should avoid inactivity. Some physical activity is better than none, and adults who participate in any amount of physical activity gain some health benefits.  Behavioral modification strategies: increasing lean protein intake, meal planning and cooking strategies, keeping healthy foods in the home, planning for success, and keeping a strict food journal.  Jean Phillips has agreed to follow-up with our clinic in 3 weeks. She was informed of the importance of frequent follow-up visits to maximize her success with intensive lifestyle modifications for her multiple health conditions.   Jean Phillips was informed we would discuss her lab results at her next visit unless there is a critical issue that needs to be addressed sooner. Jean Phillips agreed to keep her next visit at the agreed upon time to discuss these results.  Objective:   Blood pressure 101/67, pulse 90, temperature 98.6 F (37 C), height 5\' 4"  (1.626 m), weight 200 lb (90.7 kg), SpO2 99 %. Body mass index is 34.33 kg/m.  General: Cooperative, alert, well developed, in no acute distress. HEENT: Conjunctivae and lids unremarkable. Cardiovascular: Regular rhythm.  Lungs: Normal work of breathing. Neurologic:  No focal deficits.   Lab Results  Component Value Date   CREATININE 0.78 03/03/2022   BUN 10 03/03/2022   NA 138 03/03/2022   K 4.5 03/03/2022   CL 102 03/03/2022   CO2 21 03/03/2022   Lab Results  Component Value Date   ALT 15 03/03/2022   AST 18 03/03/2022   ALKPHOS 61 03/03/2022   BILITOT 0.5 03/03/2022   Lab Results  Component Value Date   HGBA1C 5.3 03/03/2022   HGBA1C  5.4 07/08/2021   HGBA1C 5.1 05/10/2016   Lab Results  Component Value Date   INSULIN 7.6 03/03/2022   INSULIN 7.2 07/08/2021   Lab Results  Component Value Date   TSH 1.350 07/08/2021   Lab Results  Component Value Date   CHOL 251 (H) 03/03/2022   HDL 56 03/03/2022   LDLCALC 170 (H) 03/03/2022   TRIG 141 03/03/2022   CHOLHDL 4 01/06/2021   Lab Results  Component Value Date   VD25OH 39.8 03/03/2022   VD25OH 23.7 (L) 07/08/2021   Lab Results  Component Value Date   WBC 7.0 07/08/2021   HGB 14.3 07/08/2021   HCT 43.5 07/08/2021   MCV 87 07/08/2021   PLT 384 07/08/2021   No results found for: "IRON", "TIBC", "FERRITIN"  Attestation Statements:   Reviewed by clinician on day of visit: allergies, medications, problem list, medical history, surgical history, family history, social history, and previous encounter notes.  I, Fortino Sic, RMA am acting as transcriptionist for Reuben Likes, MD. I have reviewed the above documentation for accuracy and completeness, and I agree with the above. - Reuben Likes, MD

## 2022-03-13 ENCOUNTER — Other Ambulatory Visit (HOSPITAL_COMMUNITY): Payer: Self-pay

## 2022-03-15 ENCOUNTER — Other Ambulatory Visit (HOSPITAL_COMMUNITY): Payer: Self-pay

## 2022-03-22 ENCOUNTER — Other Ambulatory Visit (HOSPITAL_COMMUNITY): Payer: Self-pay

## 2022-03-22 MED ORDER — LEVONORGESTREL-ETHINYL ESTRAD 0.1-20 MG-MCG PO TABS
1.0000 | ORAL_TABLET | Freq: Every day | ORAL | 4 refills | Status: DC
  Filled 2022-03-22: qty 84, 84d supply, fill #0
  Filled 2022-04-19: qty 112, 84d supply, fill #0
  Filled 2022-07-16 (×2): qty 112, 84d supply, fill #1
  Filled 2022-10-08 (×2): qty 112, 84d supply, fill #2
  Filled 2023-01-03: qty 84, 63d supply, fill #3

## 2022-04-05 ENCOUNTER — Ambulatory Visit: Payer: No Typology Code available for payment source | Admitting: Adult Health

## 2022-04-09 ENCOUNTER — Telehealth: Payer: Self-pay | Admitting: Physician Assistant

## 2022-04-09 DIAGNOSIS — H1031 Unspecified acute conjunctivitis, right eye: Secondary | ICD-10-CM

## 2022-04-10 ENCOUNTER — Other Ambulatory Visit (HOSPITAL_COMMUNITY): Payer: Self-pay

## 2022-04-10 MED ORDER — POLYMYXIN B-TRIMETHOPRIM 10000-0.1 UNIT/ML-% OP SOLN
1.0000 [drp] | Freq: Four times a day (QID) | OPHTHALMIC | 0 refills | Status: DC
  Filled 2022-04-10: qty 10, 25d supply, fill #0

## 2022-04-10 NOTE — Progress Notes (Signed)
I have spent 5 minutes in review of e-visit questionnaire, review and updating patient chart, medical decision making and response to patient.   Ludmilla Mcgillis Cody Jaleesa Cervi, PA-C    

## 2022-04-10 NOTE — Progress Notes (Signed)

## 2022-04-13 NOTE — Progress Notes (Unsigned)
[id number]    Provider:  Dr Jaynee Eagles Requesting Provider: Mosie Lukes, MD Primary Care Provider:  Mosie Lukes, MD  CC:  Migraines   No chief complaint on file.     HPI:  Update 05/24/2022 JM: Patient returns for 60-month migraine follow-up.  She has remained on Emgality, reports currently *** migraine days per month.  Use of Imitrex for abortive therapy ***.       History provided for reference purposes only UPDATE 09/30/2021 JM: Patient returns for chronic migraine headaches.  She was previously seen by Dr. Jaynee Eagles in 05/2020.  Due to insurance requirements, unable to remain on Ajovy and was required to trial Emgality first.  She recently started [facility], has completed 2 injections  and due to next injection soon. She does believe she had greater benefit on Ajovy but is aware she needs to give Emgality some time.  Over the past month, she reports approximately 5 migraines lasting anywhere from 24 to 72 hours.  Use of Imitrex for migrainous headaches, is not beneficial during the day but will take at night in combination with Phenergan and 50% of the time will wake up without residual migraine. She does have occasional mild non migrainous headaches.   Interval history 06/01/2020 Dr. Jaynee Eagles: Patient with chronic migraines was started on Ajovy. Here for follow up. Baseline was 23 headache days out of the month, and 10 -12 moderately severe to severe migraines a month. Now 10 headache days which includes 5 migraine days. And imitrex helps acutely. Nurtec didn't help. She is extremely happy.  MRI brain 02/13/2020:   IMPRESSION: This MRI of the brain with and without contrast shows the following: 1.   The brain appears normal before and after contrast. 2.   Mild maxillary and ethmoid chronic sinusitis. 3.   Left mastoid effusion.  This could be due to eustachian tube dysfunction. 4.   There is a normal enhancement pattern no acute findings.  Consult visit  01/28/2020 Dr. Jaynee Eagles:  Jean Phillips is a 31 y.o. female here as requested by Mosie Lukes, MD for migraines. She has a PMHx of POTS, migraines. I reviewed Dr. Frederik Pear notes: Patient was last seen for annual preventative exam, this was at the end of June of this year, no recent illnesses, she is feeling well, continue to stay active and maintaining a healthy diet, she discussed migraine headaches with Dr. Charlett Blake who encouraged increased hydration, minimizing alcohol and caffeine, eating small frequent meals with lean proteins and complex carbs and avoiding high and low blood sugars, getting adequate sleep 78 hours a night, and exercising.  Physical examination was normal.  For her migraines it appears she is on Imitrex 100 mg and propranolol 10 mg.  I reviewed TSH, CMP and CBC which were all normal.  I do not see that patient had brain imaging in the notes I have available.  She has had migraines since she was a kid, she reports having them in the fourth grade and that is when they started to be migraines and not just headaches, as she is gotten older they have gotten more frequent and more severe, she is tried multiple medications if is just a headache analgesics little help if it is a migraine over-the-counter meds will not work, she has had a headache since Friday.Patient's migraines start behind te eyes maybe unilateral but either side, behind her ears and the top of the neck, temples, pulsating/pounding and throbbing and sharper, nausea, light and sound sensitivity,  they can last 24-72 hours, sumatriptan may help if she sleeps, she can wake up in the morning with headaches, vision changes and blurry vision, slowly progressive through the years. Over the last year it has worsened and she has 23 headache days out of the month, and 10 -12 moderately severe to severe migraines a month. No medication overuse. No aura. She has tried to narrow down triggers, better when she is exercing but she takes continuous  birth control so not menstrual, she tried cutting out caffeine, at work they seem to be worse, blistering migraine severe, she tries to drink, she tried to eat well. No family history of migraines. No other focal neurologic deficits, associated symptoms, inciting events or modifiable factors.  From a thorough review of records, meds tried that can be used in migraine prevention include: Tylenol, propranolol, Imitrex, ibuprofen, labetalol, metoprolol, Zofran injections and tablets, Percocet, prednisone tablets, Phenergan tablets, rizatriptan,  Reviewed notes, labs and imaging from outside physicians, which showed: see above    Review of Systems: Patient complains of symptoms per HPI as well as the following symptoms: headache . Pertinent negatives and positives per HPI. All others negative    Social History   Socioeconomic History   Marital status: Married    Spouse name: Not on file   Number of children: Not on file   Years of education: Not on file   Highest education level: Not on file  Occupational History   Occupation: Registered Nurse  Tobacco Use   Smoking status: Former   Smokeless tobacco: Never  Vaping Use   Vaping Use: Never used  Substance and Sexual Activity   Alcohol use: No   Drug use: No   Sexual activity: Yes  Other Topics Concern   Not on file  Social History Narrative   Lives with husband, son    Works with [employer], RN [employer]   No dietary restrictions.   Caffeine: 1 cup coffee in the morning and maybe once a week she has a coke.   Social Determinants of Health   Financial Resource Strain: Not on file  Food Insecurity: Not on file  Transportation Needs: Not on file  Physical Activity: Not on file  Stress: Not on file  Social Connections: Not on file  Intimate Partner Violence: Not on file    Family History  Problem Relation Age of Onset   Hyperlipidemia Mother    Diabetes Mother    Obesity Mother        s/p gastric bypass   Hypertension Mother     Depression Mother    Obesity Father    Hypertension Father    Heart disease Father        s/p MI in 2014, MI   Alcohol abuse Father    Dementia Father    Alcoholism Father    Hyperthyroidism Sister    Thyroid disease Sister        hyper   Diabetes Maternal Grandmother    Thyroid disease Maternal Grandmother        hypo, hyper   Hypertension Maternal Grandmother    Diabetes Maternal Grandfather    Hypertension Maternal Grandfather    Migraines Neg Hx     Past Medical History:  Diagnosis Date   Anemia    Back pain    Endometriosis    Lactose intolerance    Migraines    Orthostatic hypotension    POTS (postural orthostatic tachycardia syndrome)    Subchorionic hematoma  Patient Active Problem List   Diagnosis Date Noted   Class 2 obesity with body mass index (BMI) of 36.0 to 36.9 in adult 01/06/2021   Insomnia 01/06/2021   Depression with anxiety 01/06/2021   Sun-damaged skin 01/07/2020   Low back pain 04/28/2018   Migraine headache 07/08/2017   Sinus tachycardia 06/18/2016   Preventative health care 05/16/2016   Hypoglycemia 05/10/2016   POTS (postural orthostatic tachycardia syndrome)    Endometriosis     Past Surgical History:  Procedure Laterality Date   ENDOMETRIAL ABLATION     LAPAROSCOPIC OVARIAN CYSTECTOMY      Current Outpatient Medications  Medication Sig Dispense Refill   busPIRone (BUSPAR) 5 MG tablet Take 1 tablet by mouth 3 times daily as needed (anxiety). (Patient not taking: Reported on 03/03/2022) 90 tablet 0   FLUoxetine (PROZAC) 20 MG tablet Take 1 tablet (20 mg total) by mouth daily--need appointment for further refills 30 tablet 0   Galcanezumab-gnlm (EMGALITY) 120 MG/ML SOAJ Inject 120 mg into the skin every 30 days. 1 mL 5   Insulin Pen Needle (UNIFINE PENTIPS) 32G X 4 MM MISC Use as directed twice daily 100 each 0   levonorgestrel-ethinyl estradiol (ALESSE) 0.1-20 MG-MCG tablet Take 1 tablet by mouth every day 84 tablet 4    Liraglutide -Weight Management (SAXENDA) 18 MG/3ML SOPN Inject 3 mg into the skin daily. 15 mL 0   Multiple Vitamin (MULTIVITAMIN PO) Take by mouth daily.     promethazine (PHENERGAN) 25 MG tablet TAKE 1/2 - 1 TABLET BY MOUTH EVERY 6 HOURS AS NEEDED FOR NAUSEA OR VOMITING. 20 tablet 11   propranolol (INDERAL) 10 MG tablet TAKE 1 TABLET BY MOUTH TWICE DAILY 180 tablet 3   SUMAtriptan (IMITREX) 100 MG tablet TAKE 1 TABLET BY MOUTH AS NEEDED FOR MIGRAINE, MAY REPEAT IN 2 HOURS IF HEADACHE PERSISTS OR RECURS 10 tablet 11   trimethoprim-polymyxin b (POLYTRIM) ophthalmic solution Apply 1-2 drops into affected eye 4 (four) times daily. 10 mL 0   Vitamin D, Ergocalciferol, (DRISDOL) 1.25 MG (50000 UNIT) CAPS capsule Take 1 capsule by mouth every 7 days. 4 capsule 0   No current facility-administered medications for this visit.    Allergies as of 04/14/2022   (No Known Allergies)    Vitals: There were no vitals filed for this visit.  There is no height or weight on file to calculate BMI.   Exam: NAD, pleasant, young Caucasian female                 Speech:    Speech is normal; fluent and spontaneous with normal comprehension.  Cognition:    The patient is oriented to person, place, and time;     recent and remote memory intact;     language fluent;    Cranial Nerves:    The pupils are equal, round, and reactive to light. The face is symmetric. The palate elevates in the midline. Hearing intact. Voice is normal. Shoulder shrug is normal. The tongue has normal motion without fasciculations.   Coordination:  No dysmetria  Motor Observation:    No asymmetry, no atrophy, and no involuntary movements noted. Tone:    Normal muscle tone.     Strength:    Strength is V/V in the upper and lower limbs.      Sensation: intact to LT      Assessment/Plan:  31 year old with chronic migraines. Here for follow up. Baseline was 23 headache days out of the  month, and 10 -12 moderately severe to  severe migraines a month. Great benefit with Ajovy but due to insurance requirements, currently on Emgality. Currently having aprox 5 migraine days per month lasting 24-72 hours. Use of imitrex working approx 50% of the time.  MRI brain 02/2020 unremarkable.   Will continue current plan:  Preventative: Continue Emgality - is due for 3rd injection soon. Can consider going back to Ajovy after at least 3 month trial on Emgality if needed Acute management: Continue Imitrex and Phenergan -discussed trialing a different triptan such as naratriptan as imitrex works about 50% of the time and provides no benefit during the day but she would like to continue on Imitrex for now.  Refills provided   No orders of the defined types were placed in this encounter.  No orders of the defined types were placed in this encounter.    Follow up in 6 months or call earlier if needed   CC:  Bradd Canary, MD   I spent 23 minutes of face-to-face and non-face-to-face time with patient.  This included previsit chart review, lab review, study review, order entry, electronic health record documentation, patient education and discussion regarding migraine headaches, current treatment plan and possible future treatment plan and answered all other questions to patient's satisfaction  Ihor Austin, [facility]  [facility] [address] [place], [place] [address]  Phone [phone] Fax [phone] Note: This document was prepared with digital dictation and possible smart phrase technology. Any transcriptional errors that result from this process are unintentional.

## 2022-04-14 ENCOUNTER — Encounter: Payer: Self-pay | Admitting: Adult Health

## 2022-04-14 ENCOUNTER — Ambulatory Visit: Payer: No Typology Code available for payment source | Admitting: Adult Health

## 2022-04-14 ENCOUNTER — Encounter (INDEPENDENT_AMBULATORY_CARE_PROVIDER_SITE_OTHER): Payer: Self-pay | Admitting: Family Medicine

## 2022-04-14 ENCOUNTER — Ambulatory Visit (INDEPENDENT_AMBULATORY_CARE_PROVIDER_SITE_OTHER): Payer: No Typology Code available for payment source | Admitting: Family Medicine

## 2022-04-14 ENCOUNTER — Other Ambulatory Visit (HOSPITAL_COMMUNITY): Payer: Self-pay

## 2022-04-14 VITALS — BP 96/63 | HR 72 | Temp 98.5°F | Ht 64.0 in | Wt 197.0 lb

## 2022-04-14 VITALS — BP 115/64 | HR 60 | Ht 64.0 in | Wt 201.5 lb

## 2022-04-14 DIAGNOSIS — E559 Vitamin D deficiency, unspecified: Secondary | ICD-10-CM | POA: Diagnosis not present

## 2022-04-14 DIAGNOSIS — G43709 Chronic migraine without aura, not intractable, without status migrainosus: Secondary | ICD-10-CM

## 2022-04-14 DIAGNOSIS — Z6833 Body mass index (BMI) 33.0-33.9, adult: Secondary | ICD-10-CM | POA: Diagnosis not present

## 2022-04-14 DIAGNOSIS — E669 Obesity, unspecified: Secondary | ICD-10-CM

## 2022-04-14 DIAGNOSIS — E7849 Other hyperlipidemia: Secondary | ICD-10-CM | POA: Diagnosis not present

## 2022-04-14 MED ORDER — VITAMIN D (ERGOCALCIFEROL) 1.25 MG (50000 UNIT) PO CAPS
50000.0000 [IU] | ORAL_CAPSULE | ORAL | 0 refills | Status: DC
  Filled 2022-04-14: qty 4, 28d supply, fill #0

## 2022-04-14 MED ORDER — EMGALITY 120 MG/ML ~~LOC~~ SOAJ
120.0000 mg | SUBCUTANEOUS | 11 refills | Status: DC
  Filled 2022-04-14: qty 1, 30d supply, fill #0
  Filled 2022-09-28 – 2023-01-03 (×4): qty 1, 30d supply, fill #1

## 2022-04-14 MED ORDER — PROMETHAZINE HCL 25 MG PO TABS
ORAL_TABLET | ORAL | 11 refills | Status: DC
  Filled 2022-04-14: qty 20, 5d supply, fill #0

## 2022-04-14 MED ORDER — SUMATRIPTAN SUCCINATE 100 MG PO TABS
ORAL_TABLET | ORAL | 11 refills | Status: DC
  Filled 2022-04-14: qty 9, 30d supply, fill #0
  Filled 2022-09-28: qty 9, 30d supply, fill #1
  Filled 2023-01-30: qty 9, 30d supply, fill #2

## 2022-04-14 NOTE — Patient Instructions (Addendum)
It was great to see you again today!  No changes today - continue Emgality monthly injection  Continue Imitrex as needed for rescue medication    Follow-up in 1 year or call earlier if needed

## 2022-04-15 ENCOUNTER — Other Ambulatory Visit (HOSPITAL_COMMUNITY): Payer: Self-pay

## 2022-04-19 ENCOUNTER — Other Ambulatory Visit (HOSPITAL_COMMUNITY): Payer: Self-pay

## 2022-04-19 NOTE — Progress Notes (Signed)
Chief Complaint:   OBESITY Jean Phillips is here to discuss her progress with her obesity treatment plan along with follow-up of her obesity related diagnoses. Jean Phillips is on keeping a food journal and adhering to recommended goals of 1450-1600 calories and 90+ grams of protein and states she is following her eating plan approximately 75% of the time. Jean Phillips states she is exercising 0 minutes 0 times per week.  Today's visit was #: 11 Starting weight: 193 lbs Starting date: 07/08/2021 Today's weight: 197 lbs Today's date: 04/14/2022 Total lbs lost to date: 0 Total lbs lost since last in-office visit: 3  Interim History: Cheyrl went through calender and planned food for 3 weeks with her work schedule. Averaging 1300-1600 calories and 120 g of protein most days. Felt terribly on Saxenda. Next few weeks she has a neurosurgeon appointment for Oct 20th. Which may be followed by a trip to [place]. Planning to go trick or treating.  Subjective:   1. Vitamin D deficiency Jean Phillips is currently taking prescription Vit D 50,000 IU once a week. Denies any nausea, vomiting or muscle weakness. She notes fatigue.  2. Other hyperlipidemia Jean Phillips's last LDL 170, HDL 56, Trigly 141. She is not on medications.  Assessment/Plan:   1. Vitamin D deficiency We will refill Vit D 50,000 IU once weekly for 1 month with 0 refills.  -Refill Vitamin D, Ergocalciferol, (DRISDOL) 1.25 MG (50000 UNIT) CAPS capsule; Take 1 capsule by mouth every 7 days.  Dispense: 4 capsule; Refill: 0  2. Other hyperlipidemia Will obtain FLP in January.  3. Obesity with current BMI of 33.8. Jean Phillips is currently in the action stage of change. As such, her goal is to continue with weight loss efforts. She has agreed to keeping a food journal and adhering to recommended goals of 1450-1500 calories and 90+ grams of protein.   Exercise goals: No exercise has been prescribed at this time.  Behavioral modification strategies: increasing lean  protein intake, meal planning and cooking strategies, keeping healthy foods in the home, and planning for success.  Jean Phillips has agreed to follow-up with our clinic in 4 weeks. She was informed of the importance of frequent follow-up visits to maximize her success with intensive lifestyle modifications for her multiple health conditions.   Objective:   Blood pressure 96/63, pulse 72, temperature 98.5 F (36.9 C), height 5\' 4"  (1.626 m), weight 197 lb (89.4 kg), SpO2 98 %. Body mass index is 33.81 kg/m.  General: Cooperative, alert, well developed, in no acute distress. HEENT: Conjunctivae and lids unremarkable. Cardiovascular: Regular rhythm.  Lungs: Normal work of breathing. Neurologic: No focal deficits.   Lab Results  Component Value Date   CREATININE 0.78 03/03/2022   BUN 10 03/03/2022   NA 138 03/03/2022   K 4.5 03/03/2022   CL 102 03/03/2022   CO2 21 03/03/2022   Lab Results  Component Value Date   ALT 15 03/03/2022   AST 18 03/03/2022   ALKPHOS 61 03/03/2022   BILITOT 0.5 03/03/2022   Lab Results  Component Value Date   HGBA1C 5.3 03/03/2022   HGBA1C 5.4 07/08/2021   HGBA1C 5.1 05/10/2016   Lab Results  Component Value Date   INSULIN 7.6 03/03/2022   INSULIN 7.2 07/08/2021   Lab Results  Component Value Date   TSH 1.350 07/08/2021   Lab Results  Component Value Date   CHOL 251 (H) 03/03/2022   HDL 56 03/03/2022   LDLCALC 170 (H) 03/03/2022   TRIG 141 03/03/2022  CHOLHDL 4 01/06/2021   Lab Results  Component Value Date   VD25OH 39.8 03/03/2022   VD25OH 23.7 (L) 07/08/2021   Lab Results  Component Value Date   WBC 7.0 07/08/2021   HGB 14.3 07/08/2021   HCT 43.5 07/08/2021   MCV 87 07/08/2021   PLT 384 07/08/2021   No results found for: "IRON", "TIBC", "FERRITIN"  Attestation Statements:   Reviewed by clinician on day of visit: allergies, medications, problem list, medical history, surgical history, family history, social history, and  previous encounter notes.  I, Elnora Morrison, RMA am acting as transcriptionist for Coralie Common, MD.  I have reviewed the above documentation for accuracy and completeness, and I agree with the above. - Coralie Common, MD

## 2022-04-20 ENCOUNTER — Other Ambulatory Visit (HOSPITAL_COMMUNITY): Payer: Self-pay

## 2022-05-17 ENCOUNTER — Ambulatory Visit (INDEPENDENT_AMBULATORY_CARE_PROVIDER_SITE_OTHER): Payer: No Typology Code available for payment source | Admitting: Family Medicine

## 2022-06-24 ENCOUNTER — Encounter: Payer: No Typology Code available for payment source | Admitting: Family Medicine

## 2022-07-08 ENCOUNTER — Other Ambulatory Visit (HOSPITAL_COMMUNITY): Payer: Self-pay

## 2022-07-08 ENCOUNTER — Encounter (HOSPITAL_COMMUNITY): Payer: Self-pay

## 2022-07-08 ENCOUNTER — Ambulatory Visit (INDEPENDENT_AMBULATORY_CARE_PROVIDER_SITE_OTHER): Payer: BC Managed Care – PPO | Admitting: Family Medicine

## 2022-07-08 ENCOUNTER — Encounter: Payer: Self-pay | Admitting: Family Medicine

## 2022-07-08 ENCOUNTER — Other Ambulatory Visit: Payer: Self-pay

## 2022-07-08 VITALS — BP 115/83 | HR 86 | Temp 98.2°F | Resp 16 | Ht 64.0 in | Wt 207.6 lb

## 2022-07-08 DIAGNOSIS — R000 Tachycardia, unspecified: Secondary | ICD-10-CM

## 2022-07-08 DIAGNOSIS — Z0000 Encounter for general adult medical examination without abnormal findings: Secondary | ICD-10-CM

## 2022-07-08 DIAGNOSIS — Z111 Encounter for screening for respiratory tuberculosis: Secondary | ICD-10-CM | POA: Diagnosis not present

## 2022-07-08 DIAGNOSIS — F418 Other specified anxiety disorders: Secondary | ICD-10-CM

## 2022-07-08 DIAGNOSIS — Z6379 Other stressful life events affecting family and household: Secondary | ICD-10-CM

## 2022-07-08 DIAGNOSIS — J0140 Acute pansinusitis, unspecified: Secondary | ICD-10-CM | POA: Diagnosis not present

## 2022-07-08 LAB — CBC WITH DIFFERENTIAL/PLATELET
Basophils Absolute: 0 10*3/uL (ref 0.0–0.1)
Basophils Relative: 0.4 % (ref 0.0–3.0)
Eosinophils Absolute: 0.1 10*3/uL (ref 0.0–0.7)
Eosinophils Relative: 2.1 % (ref 0.0–5.0)
HCT: 39.9 % (ref 36.0–46.0)
Hemoglobin: 13.5 g/dL (ref 12.0–15.0)
Lymphocytes Relative: 27.2 % (ref 12.0–46.0)
Lymphs Abs: 1.9 10*3/uL (ref 0.7–4.0)
MCHC: 33.9 g/dL (ref 30.0–36.0)
MCV: 86.8 fl (ref 78.0–100.0)
Monocytes Absolute: 0.6 10*3/uL (ref 0.1–1.0)
Monocytes Relative: 8.1 % (ref 3.0–12.0)
Neutro Abs: 4.3 10*3/uL (ref 1.4–7.7)
Neutrophils Relative %: 62.2 % (ref 43.0–77.0)
Platelets: 384 10*3/uL (ref 150.0–400.0)
RBC: 4.6 Mil/uL (ref 3.87–5.11)
RDW: 13.2 % (ref 11.5–15.5)
WBC: 7 10*3/uL (ref 4.0–10.5)

## 2022-07-08 LAB — TSH: TSH: 1.71 u[IU]/mL (ref 0.35–5.50)

## 2022-07-08 LAB — LIPID PANEL
Cholesterol: 222 mg/dL — ABNORMAL HIGH (ref 0–200)
HDL: 45.3 mg/dL (ref >39.00)
LDL Cholesterol: 147 mg/dL — ABNORMAL HIGH (ref 0–99)
NonHDL: 176.4
Total CHOL/HDL Ratio: 5
Triglycerides: 148 mg/dL (ref 0.0–149.0)
VLDL: 29.6 mg/dL (ref 0.0–40.0)

## 2022-07-08 LAB — COMPREHENSIVE METABOLIC PANEL
ALT: 16 U/L (ref 0–35)
AST: 13 U/L (ref 0–37)
Albumin: 4.2 g/dL (ref 3.5–5.2)
Alkaline Phosphatase: 63 U/L (ref 39–117)
BUN: 17 mg/dL (ref 6–23)
CO2: 26 mEq/L (ref 19–32)
Calcium: 9.5 mg/dL (ref 8.4–10.5)
Chloride: 104 mEq/L (ref 96–112)
Creatinine, Ser: 0.72 mg/dL (ref 0.40–1.20)
GFR: 112.7 mL/min (ref >60.00)
Glucose, Bld: 89 mg/dL (ref 70–99)
Potassium: 4.5 mEq/L (ref 3.5–5.1)
Sodium: 137 mEq/L (ref 135–145)
Total Bilirubin: 0.4 mg/dL (ref 0.2–1.2)
Total Protein: 6.8 g/dL (ref 6.0–8.3)

## 2022-07-08 MED ORDER — FLUOXETINE HCL 20 MG PO TABS
20.0000 mg | ORAL_TABLET | Freq: Every day | ORAL | 3 refills | Status: DC
  Filled 2022-07-08: qty 30, 30d supply, fill #0
  Filled 2022-08-18: qty 30, 30d supply, fill #1
  Filled 2022-09-28: qty 30, 30d supply, fill #2
  Filled 2022-10-29: qty 30, 30d supply, fill #3

## 2022-07-08 MED ORDER — PROPRANOLOL HCL 10 MG PO TABS
10.0000 mg | ORAL_TABLET | Freq: Two times a day (BID) | ORAL | 3 refills | Status: DC
  Filled 2022-07-08 – 2022-08-18 (×2): qty 180, 90d supply, fill #0
  Filled 2022-10-29: qty 180, 90d supply, fill #1
  Filled 2023-02-12: qty 180, 90d supply, fill #2
  Filled 2023-05-11: qty 180, 90d supply, fill #3

## 2022-07-08 MED ORDER — PREDNISONE 20 MG PO TABS
20.0000 mg | ORAL_TABLET | Freq: Every day | ORAL | 0 refills | Status: AC
  Filled 2022-07-08: qty 5, 5d supply, fill #0

## 2022-07-08 MED ORDER — BUSPIRONE HCL 5 MG PO TABS
5.0000 mg | ORAL_TABLET | Freq: Three times a day (TID) | ORAL | 0 refills | Status: DC | PRN
  Filled 2022-07-08: qty 90, 30d supply, fill #0

## 2022-07-08 MED ORDER — AMOXICILLIN-POT CLAVULANATE 875-125 MG PO TABS
1.0000 | ORAL_TABLET | Freq: Two times a day (BID) | ORAL | 0 refills | Status: DC
  Filled 2022-07-08: qty 20, 10d supply, fill #0

## 2022-07-08 NOTE — Progress Notes (Signed)
Complete physical exam  Patient: Jean Phillips   DOB: [birth year]   31 y.o. Female  MRN: [id number]  Subjective:    Chief Complaint  Patient presents with   Annual Exam    Jean Phillips is a 32 y.o. female who presents today for a complete physical exam. She reports consuming a general diet. The patient does not participate in regular exercise at present. She generally feels well. She reports sleeping well. She does  have additional problems to discuss today.   Patient reports he has sinusitis symptoms off and on for the past month. She will get better for a day or two and then symptoms return. Currently she has sinus pressure, headaches, nasal congestion with purulent mucus, bilateral ear pressure. She has only been taking OTC medications so far with minimal improvement.     Most recent fall risk assessment:    07/08/2022    1:42 PM  Fall Risk   Falls in the past year? 0  Number falls in past yr: 0  Injury with Fall? 0     Most recent depression screenings:    07/08/2022    9:44 AM 07/08/2021    7:27 AM  PHQ 2/9 Scores  PHQ - 2 Score 2 4  PHQ- 9 Score 8 13      07/08/2022    9:45 AM  GAD 7 : Generalized Anxiety Score  Nervous, Anxious, on Edge 1  Control/stop worrying 2  Worry too much - different things 1  Trouble relaxing 2  Restless 1  Easily annoyed or irritable 2  Afraid - awful might happen 1  Total GAD 7 Score 10  Anxiety Difficulty Somewhat difficult    She stopped taking her Buspar and Fluoxetine for the past few months, needs refills. She is trying to do counseling/therapy through work.   Vision:Not within last year , Dental: No current dental problems and Receives regular dental care, and STD: no concerns   Patient Active Problem List   Diagnosis Date Noted   Class 2 obesity with body mass index (BMI) of 36.0 to 36.9 in adult 01/06/2021   Insomnia 01/06/2021   Depression with anxiety 01/06/2021   Sun-damaged skin 01/07/2020   Low back pain  04/28/2018   Migraine headache 07/08/2017   Sinus tachycardia 06/18/2016   Preventative health care 05/16/2016   Hypoglycemia 05/10/2016   POTS (postural orthostatic tachycardia syndrome)    Endometriosis    Family History  Problem Relation Age of Onset   Hyperlipidemia Mother    Diabetes Mother    Obesity Mother        s/p gastric bypass   Hypertension Mother    Depression Mother    Obesity Father    Hypertension Father    Heart disease Father        s/p MI in 2014, MI   Alcohol abuse Father    Dementia Father    Alcoholism Father    Hyperthyroidism Sister    Thyroid disease Sister        hyper   Diabetes Maternal Grandmother    Thyroid disease Maternal Grandmother        hypo, hyper   Hypertension Maternal Grandmother    Diabetes Maternal Grandfather    Hypertension Maternal Grandfather    Migraines Neg Hx    No Known Allergies    Patient Care Team: Bradd Canary, MD as PCP - General (Family Medicine) Quintella Reichert, MD as PCP - Cardiology (Cardiology) Philip Aspen, DO as  Consulting Physician (Obstetrics and Gynecology) Nahser, Wonda Cheng, MD as Consulting Physician (Cardiology)   Outpatient Medications Prior to Visit  Medication Sig   Galcanezumab-gnlm (EMGALITY) 120 MG/ML SOAJ Inject 120 mg into the skin every 30 days.   levonorgestrel-ethinyl estradiol (ALESSE) 0.1-20 MG-MCG tablet Take 1 tablet by mouth every day (continuously)   Multiple Vitamin (MULTIVITAMIN PO) Take by mouth daily.   promethazine (PHENERGAN) 25 MG tablet TAKE 1/2 - 1 TABLET BY MOUTH EVERY 6 HOURS AS NEEDED FOR NAUSEA OR VOMITING.   SUMAtriptan (IMITREX) 100 MG tablet TAKE 1 TABLET BY MOUTH AS NEEDED FOR MIGRAINE, MAY REPEAT IN 2 HOURS IF HEADACHE PERSISTS OR RECURS   Vitamin D, Ergocalciferol, (DRISDOL) 1.25 MG (50000 UNIT) CAPS capsule Take 1 capsule by mouth every 7 days.   [DISCONTINUED] propranolol (INDERAL) 10 MG tablet TAKE 1 TABLET BY MOUTH TWICE DAILY   [DISCONTINUED]  busPIRone (BUSPAR) 5 MG tablet Take 1 tablet by mouth 3 times daily as needed (anxiety). (Patient not taking: Reported on 07/08/2022)   [DISCONTINUED] FLUoxetine (PROZAC) 20 MG tablet Take 1 tablet (20 mg total) by mouth daily--need appointment for further refills (Patient not taking: Reported on 07/08/2022)   [DISCONTINUED] trimethoprim-polymyxin b (POLYTRIM) ophthalmic solution Apply 1-2 drops into affected eye 4 (four) times daily.   No facility-administered medications prior to visit.    ROS All review of systems negative except what is listed in the HPI        Objective:     BP 115/83   Pulse 86   Temp 98.2 F (36.8 C)   Resp 16   Ht 5\' 4"  (1.626 m)   Wt 207 lb 9.6 oz (94.2 kg)   SpO2 98%   BMI 35.63 kg/m     Physical Exam Vitals reviewed.  Constitutional:      General: She is not in acute distress.    Appearance: Normal appearance. She is not ill-appearing.  HENT:     Head: Normocephalic and atraumatic.     Right Ear: Tympanic membrane normal.     Left Ear: Tympanic membrane normal.     Nose: Congestion present.     Mouth/Throat:     Mouth: Mucous membranes are moist.     Pharynx: Oropharynx is clear.  Eyes:     Extraocular Movements: Extraocular movements intact.     Conjunctiva/sclera: Conjunctivae normal.     Pupils: Pupils are equal, round, and reactive to light.  Neck:     Vascular: No carotid bruit.  Cardiovascular:     Rate and Rhythm: Normal rate and regular rhythm.     Pulses: Normal pulses.     Heart sounds: Normal heart sounds.  Pulmonary:     Effort: Pulmonary effort is normal.     Breath sounds: Normal breath sounds.  Abdominal:     General: Abdomen is flat. Bowel sounds are normal. There is no distension.     Palpations: Abdomen is soft. There is no mass.     Tenderness: There is no abdominal tenderness. There is no right CVA tenderness, left CVA tenderness, guarding or rebound.  Genitourinary:    Comments: Deferred exam Musculoskeletal:         General: Normal range of motion.     Cervical back: Normal range of motion and neck supple. No tenderness.     Right lower leg: No edema.     Left lower leg: No edema.  Lymphadenopathy:     Cervical: No cervical adenopathy.  Skin:  General: Skin is warm and dry.     Capillary Refill: Capillary refill takes less than 2 seconds.  Neurological:     General: No focal deficit present.     Mental Status: She is alert and oriented to person, place, and time. Mental status is at baseline.  Psychiatric:        Mood and Affect: Mood normal.        Behavior: Behavior normal.        Thought Content: Thought content normal.        Judgment: Judgment normal.        No results found for any visits on 07/08/22.     Assessment & Plan:    Routine Health Maintenance and Physical Exam  Immunization History  Administered Date(s) Administered   Influenza Inj Mdck Quad With Preservative 04/04/2017   Influenza,inj,Quad PF,6+ Mos 06/20/2015   Influenza-Unspecified 03/05/2016, 03/09/2018, 04/05/2022   PFIZER(Purple Top)SARS-COV-2 Vaccination 04/01/2020, 04/22/2020   Tdap 03/27/2015    Health Maintenance  Topic Date Due   Hepatitis C Screening  Never done   COVID-19 Vaccine (3 - 2023-24 season) 03/05/2022   PAP-Cervical Cytology Screening  03/16/2024   PAP SMEAR-Modifier  03/16/2024   DTaP/Tdap/Td (2 - Td or Tdap) 03/26/2025   INFLUENZA VACCINE  Completed   HIV Screening  Completed   HPV VACCINES  Aged Out    Discussed health benefits of physical activity, and encouraged her to engage in regular exercise appropriate for her age and condition.  Problem List Items Addressed This Visit       Other   Sinus tachycardia Stable. No acute concerns. Refill provided.    Relevant Medications   propranolol (INDERAL) 10 MG tablet   Depression with anxiety Refilling meds. Adding referral for counseling.   Relevant Medications   busPIRone (BUSPAR) 5 MG tablet   FLUoxetine (PROZAC) 20 MG  tablet   Other Relevant Orders   Ambulatory referral to [facility]   Other Visit Diagnoses     Screening-pulmonary TB    -  Primary Screening for school.   Relevant Orders   QuantiFERON-TB Gold Plus   Stressful life event affecting family     Refilling meds. Adding referral for counseling.   Relevant Medications   busPIRone (BUSPAR) 5 MG tablet   FLUoxetine (PROZAC) 20 MG tablet   Other Relevant Orders   Ambulatory referral to [facility]   Acute non-recurrent pansinusitis   Given duration, adding Augmentin and prednisone.  Continue supportive measures including rest, hydration, humidifier use, steam showers, warm compresses to sinuses, warm liquids with lemon and honey, and over-the-counter cough, cold, and analgesics as needed.  Patient aware of signs/symptoms requiring further/urgent evaluation.      Relevant Medications   predniSONE (DELTASONE) 20 MG tablet   amoxicillin-clavulanate (AUGMENTIN) 875-125 MG tablet   Annual physical exam       Relevant Orders   CBC with Differential/Platelet   Comprehensive metabolic panel   Lipid panel   TSH      Return in about 6 weeks (around 08/19/2022) for mood f/u.     Terrilyn Saver, NP

## 2022-07-09 LAB — QUANTIFERON-TB GOLD PLUS
Mitogen-NIL: >10 IU/mL
NIL: 0.06 IU/mL
QuantiFERON-TB Gold Plus: NEGATIVE
TB1-NIL: 0.01 IU/mL
TB2-NIL: 0 IU/mL

## 2022-07-14 ENCOUNTER — Other Ambulatory Visit: Payer: Self-pay

## 2022-07-16 ENCOUNTER — Other Ambulatory Visit (HOSPITAL_COMMUNITY): Payer: Self-pay

## 2022-08-18 ENCOUNTER — Other Ambulatory Visit (HOSPITAL_COMMUNITY): Payer: Self-pay

## 2022-08-18 ENCOUNTER — Encounter: Payer: Self-pay | Admitting: Family Medicine

## 2022-08-18 ENCOUNTER — Ambulatory Visit: Payer: BC Managed Care – PPO | Admitting: Family Medicine

## 2022-08-18 ENCOUNTER — Other Ambulatory Visit: Payer: Self-pay

## 2022-08-18 VITALS — BP 114/88 | HR 78 | Temp 98.5°F | Resp 16 | Ht 64.0 in | Wt 207.6 lb

## 2022-08-18 DIAGNOSIS — F418 Other specified anxiety disorders: Secondary | ICD-10-CM | POA: Diagnosis not present

## 2022-08-18 DIAGNOSIS — E559 Vitamin D deficiency, unspecified: Secondary | ICD-10-CM | POA: Diagnosis not present

## 2022-08-18 LAB — VITAMIN D 25 HYDROXY (VIT D DEFICIENCY, FRACTURES): VITD: 33.55 ng/mL (ref 30.00–100.00)

## 2022-08-18 MED ORDER — VITAMIN D (ERGOCALCIFEROL) 1.25 MG (50000 UNIT) PO CAPS
50000.0000 [IU] | ORAL_CAPSULE | ORAL | 0 refills | Status: DC
  Filled 2022-08-18: qty 4, 28d supply, fill #0

## 2022-08-18 NOTE — Assessment & Plan Note (Signed)
Doing well on Prozac and PRN Buspar. No acute concerns. No SI/HI. Continue current regimen. Continue counseling.

## 2022-08-18 NOTE — Assessment & Plan Note (Signed)
Refill provided. Recheck labs today.

## 2022-08-18 NOTE — Progress Notes (Signed)
Established Patient Office Visit  Subjective   Patient ID: Jean Phillips, female    DOB: 09-11-1990  Age: 32 y.o. MRN: [id number]  Chief Complaint  Patient presents with   Follow-up    Medication    HPI  Mood follow-up: At last visit on 07/08/2022 she reported that she had tried going without her medications for a few months with just counseling/therapy, but was struggling and wanted to restart meds.  - Diagnosis: anxiety and depression  - Treatment: Buspar 5 mg TID PRN, Prozac 20 mg daily, counseling  - Medication side effects: none - SI/HI: none - Update: Today she reports she is doing well and feels better overall. Dose seems to be adequate right now. No new concerns.      08/18/2022   11:46 AM 07/08/2022    9:44 AM 07/08/2021    7:27 AM  PHQ9 SCORE ONLY  PHQ-9 Total Score 5 8 13      $ 08/18/2022   11:46 AM 07/08/2022    9:45 AM  GAD 7 : Generalized Anxiety Score  Nervous, Anxious, on Edge 1 1  Control/stop worrying 1 2  Worry too much - different things 0 1  Trouble relaxing 1 2  Restless 0 1  Easily annoyed or irritable 1 2  Afraid - awful might happen 1 1  Total GAD 7 Score 5 10  Anxiety Difficulty Not difficult at all Somewhat difficult         ROS All review of systems negative except what is listed in the HPI    Objective:     BP 114/88   Pulse 78   Temp 98.5 F (36.9 C)   Resp 16   Ht 5' 4"$  (1.626 m)   Wt 207 lb 9.6 oz (94.2 kg)   SpO2 97%   BMI 35.63 kg/m    Physical Exam Vitals reviewed.  Constitutional:      Appearance: Normal appearance.  Cardiovascular:     Rate and Rhythm: Normal rate and regular rhythm.     Pulses: Normal pulses.     Heart sounds: Normal heart sounds.  Pulmonary:     Effort: Pulmonary effort is normal.     Breath sounds: Normal breath sounds.  Skin:    General: Skin is warm and dry.  Neurological:     Mental Status: She is alert and oriented to person, place, and time.  Psychiatric:        Mood and Affect:  Mood normal.        Behavior: Behavior normal.        Thought Content: Thought content normal.        Judgment: Judgment normal.      No results found for any visits on 08/18/22.    The ASCVD Risk score (Arnett DK, et al., 2019) failed to calculate for the following reasons:   The 2019 ASCVD risk score is only valid for ages 69 to 49    Assessment & Plan:   Problem List Items Addressed This Visit       Other   Depression with anxiety - Primary    Doing well on Prozac and PRN Buspar. No acute concerns. No SI/HI. Continue current regimen. Continue counseling.       Vitamin D deficiency    Refill provided. Recheck labs today.       Relevant Medications   Vitamin D, Ergocalciferol, (DRISDOL) 1.25 MG (50000 UNIT) CAPS capsule   Other Relevant Orders   Vitamin D (25  hydroxy)    Return in about 6 months (around 02/16/2023) for routine follow-up.    Terrilyn Saver, NP

## 2022-08-19 ENCOUNTER — Other Ambulatory Visit (HOSPITAL_COMMUNITY): Payer: Self-pay

## 2022-08-24 ENCOUNTER — Other Ambulatory Visit: Payer: Self-pay

## 2022-08-31 ENCOUNTER — Encounter: Payer: Self-pay | Admitting: Behavioral Health

## 2022-08-31 ENCOUNTER — Ambulatory Visit (INDEPENDENT_AMBULATORY_CARE_PROVIDER_SITE_OTHER): Payer: BC Managed Care – PPO | Admitting: Behavioral Health

## 2022-08-31 DIAGNOSIS — F4323 Adjustment disorder with mixed anxiety and depressed mood: Secondary | ICD-10-CM

## 2022-08-31 NOTE — Progress Notes (Signed)
                Jean Phillips Jean Phillips, LCMHC 
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
## 2022-09-13 ENCOUNTER — Encounter: Payer: Self-pay | Admitting: Behavioral Health

## 2022-09-13 ENCOUNTER — Ambulatory Visit: Payer: BC Managed Care – PPO | Admitting: Behavioral Health

## 2022-09-13 DIAGNOSIS — F4329 Adjustment disorder with other symptoms: Secondary | ICD-10-CM

## 2022-09-13 DIAGNOSIS — F411 Generalized anxiety disorder: Secondary | ICD-10-CM
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
## 2022-09-28 ENCOUNTER — Other Ambulatory Visit (HOSPITAL_COMMUNITY): Payer: Self-pay

## 2022-09-28 ENCOUNTER — Other Ambulatory Visit: Payer: Self-pay

## 2022-09-30 ENCOUNTER — Other Ambulatory Visit (HOSPITAL_COMMUNITY): Payer: Self-pay

## 2022-10-06 ENCOUNTER — Ambulatory Visit (INDEPENDENT_AMBULATORY_CARE_PROVIDER_SITE_OTHER): Payer: BC Managed Care – PPO | Admitting: Behavioral Health

## 2022-10-06 ENCOUNTER — Encounter: Payer: Self-pay | Admitting: Behavioral Health

## 2022-10-06 DIAGNOSIS — F4329 Adjustment disorder with other symptoms: Secondary | ICD-10-CM | POA: Diagnosis not present

## 2022-10-06 DIAGNOSIS — F418 Other specified anxiety disorders: Secondary | ICD-10-CM
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
## 2022-10-08 ENCOUNTER — Other Ambulatory Visit (HOSPITAL_COMMUNITY): Payer: Self-pay

## 2022-10-09 ENCOUNTER — Other Ambulatory Visit (HOSPITAL_COMMUNITY): Payer: Self-pay

## 2022-10-18 ENCOUNTER — Encounter: Payer: Self-pay | Admitting: Behavioral Health

## 2022-10-18 ENCOUNTER — Ambulatory Visit: Payer: BC Managed Care – PPO | Admitting: Behavioral Health

## 2022-10-29 ENCOUNTER — Other Ambulatory Visit (HOSPITAL_COMMUNITY): Payer: Self-pay

## 2022-11-01 ENCOUNTER — Ambulatory Visit: Payer: BC Managed Care – PPO | Admitting: Behavioral Health

## 2022-11-01 DIAGNOSIS — J020 Streptococcal pharyngitis: Secondary | ICD-10-CM | POA: Diagnosis not present

## 2022-11-18 ENCOUNTER — Other Ambulatory Visit: Payer: Self-pay

## 2022-12-05 ENCOUNTER — Other Ambulatory Visit: Payer: Self-pay | Admitting: Family Medicine

## 2022-12-05 DIAGNOSIS — F418 Other specified anxiety disorders: Secondary | ICD-10-CM

## 2022-12-05 DIAGNOSIS — Z6379 Other stressful life events affecting family and household: Secondary | ICD-10-CM

## 2022-12-05 MED ORDER — FLUOXETINE HCL 20 MG PO TABS
20.0000 mg | ORAL_TABLET | Freq: Every day | ORAL | 3 refills | Status: DC
  Filled 2022-12-05: qty 30, 30d supply, fill #0
  Filled 2023-01-03: qty 30, 30d supply, fill #1
  Filled 2023-01-30: qty 30, 30d supply, fill #2
  Filled 2023-03-02: qty 30, 30d supply, fill #3

## 2022-12-06 ENCOUNTER — Other Ambulatory Visit: Payer: Self-pay

## 2022-12-11 ENCOUNTER — Other Ambulatory Visit (HOSPITAL_COMMUNITY): Payer: Self-pay

## 2023-01-03 ENCOUNTER — Other Ambulatory Visit (HOSPITAL_COMMUNITY): Payer: Self-pay

## 2023-01-03 ENCOUNTER — Other Ambulatory Visit: Payer: Self-pay

## 2023-01-04 ENCOUNTER — Other Ambulatory Visit (HOSPITAL_COMMUNITY): Payer: Self-pay

## 2023-01-31 ENCOUNTER — Other Ambulatory Visit (HOSPITAL_COMMUNITY): Payer: Self-pay

## 2023-02-12 ENCOUNTER — Other Ambulatory Visit (HOSPITAL_COMMUNITY): Payer: Self-pay

## 2023-02-15 ENCOUNTER — Ambulatory Visit: Payer: BC Managed Care – PPO | Admitting: Family Medicine

## 2023-02-17 ENCOUNTER — Other Ambulatory Visit (HOSPITAL_COMMUNITY): Payer: Self-pay

## 2023-03-02 ENCOUNTER — Other Ambulatory Visit (HOSPITAL_COMMUNITY): Payer: Self-pay

## 2023-03-03 ENCOUNTER — Other Ambulatory Visit (HOSPITAL_COMMUNITY): Payer: Self-pay

## 2023-03-03 ENCOUNTER — Other Ambulatory Visit: Payer: Self-pay

## 2023-03-03 MED ORDER — LEVONORGESTREL-ETHINYL ESTRAD 0.1-20 MG-MCG PO TABS
1.0000 | ORAL_TABLET | Freq: Every day | ORAL | 1 refills | Status: DC
  Filled 2023-03-03: qty 28, 21d supply, fill #0
  Filled 2023-03-28: qty 28, 21d supply, fill #1

## 2023-03-28 ENCOUNTER — Other Ambulatory Visit: Payer: Self-pay | Admitting: Family

## 2023-03-28 DIAGNOSIS — F418 Other specified anxiety disorders: Secondary | ICD-10-CM

## 2023-03-28 DIAGNOSIS — Z6379 Other stressful life events affecting family and household: Secondary | ICD-10-CM

## 2023-03-29 ENCOUNTER — Other Ambulatory Visit: Payer: Self-pay

## 2023-04-06 LAB — HM PAP SMEAR: HM Pap smear: NORMAL

## 2023-04-12 ENCOUNTER — Other Ambulatory Visit (HOSPITAL_COMMUNITY): Payer: Self-pay

## 2023-04-12 DIAGNOSIS — Z01419 Encounter for gynecological examination (general) (routine) without abnormal findings: Secondary | ICD-10-CM | POA: Diagnosis not present

## 2023-04-12 DIAGNOSIS — Z124 Encounter for screening for malignant neoplasm of cervix: Secondary | ICD-10-CM | POA: Diagnosis not present

## 2023-04-12 MED ORDER — LEVONORGESTREL-ETHINYL ESTRAD 0.1-20 MG-MCG PO TABS
1.0000 | ORAL_TABLET | Freq: Every day | ORAL | 4 refills | Status: DC
  Filled 2023-04-12 – 2023-04-14 (×2): qty 84, 63d supply, fill #0
  Filled 2023-06-23: qty 84, 63d supply, fill #1
  Filled 2023-08-20: qty 84, 63d supply, fill #2
  Filled 2023-10-23: qty 84, 63d supply, fill #3
  Filled 2023-12-25: qty 84, 63d supply, fill #4

## 2023-04-13 ENCOUNTER — Ambulatory Visit: Payer: No Typology Code available for payment source | Admitting: Adult Health

## 2023-04-13 ENCOUNTER — Other Ambulatory Visit (HOSPITAL_COMMUNITY): Payer: Self-pay

## 2023-04-14 ENCOUNTER — Other Ambulatory Visit: Payer: Self-pay

## 2023-04-21 ENCOUNTER — Other Ambulatory Visit: Payer: Self-pay | Admitting: Adult Health

## 2023-04-21 ENCOUNTER — Other Ambulatory Visit: Payer: Self-pay

## 2023-04-21 ENCOUNTER — Encounter: Payer: Self-pay | Admitting: Adult Health

## 2023-04-21 ENCOUNTER — Ambulatory Visit: Payer: BC Managed Care – PPO | Admitting: Adult Health

## 2023-04-21 VITALS — BP 130/73 | HR 69 | Ht 64.0 in | Wt 200.0 lb

## 2023-04-21 DIAGNOSIS — G43709 Chronic migraine without aura, not intractable, without status migrainosus: Secondary | ICD-10-CM | POA: Diagnosis not present

## 2023-04-21 MED ORDER — SUMATRIPTAN SUCCINATE 100 MG PO TABS
100.0000 mg | ORAL_TABLET | ORAL | 11 refills | Status: DC | PRN
  Filled 2023-04-21: qty 9, 15d supply, fill #0
  Filled 2023-09-17: qty 9, 15d supply, fill #1
  Filled 2023-11-18: qty 9, 15d supply, fill #2
  Filled 2024-04-10: qty 9, 15d supply, fill #3

## 2023-04-21 MED ORDER — PROMETHAZINE HCL 25 MG PO TABS
12.5000 mg | ORAL_TABLET | Freq: Four times a day (QID) | ORAL | 11 refills | Status: AC | PRN
  Filled 2023-04-21: qty 20, 5d supply, fill #0

## 2023-04-21 NOTE — Patient Instructions (Addendum)
Your Plan:  Start botox process for migraine prevention  Recommend trying Bernita Raisin to take at onset of migraine headache, can repeat x1 after 2 hrs if needed - please let me know if beneficial and I will send in a prescription         Thank you for coming to see [place] at [facility]. I hope we have been able to provide you high quality care today.  You may receive a patient satisfaction survey over the next few weeks. We would appreciate your feedback and comments so that we may continue to improve ourselves and the health of our patients.

## 2023-04-21 NOTE — Progress Notes (Signed)
[facility]    Primary neurologist:  Dr Lucia Gaskins Reason for visit: Migraine headaches  CC:   Chief Complaint  Patient presents with   Follow-up    Patient in room #3 and alone. Patient states she notice her headaches gotten worse when she not on the Emgality. Patient states she would like to know more about the botox injection for chronic migraines.      HPI:  Update 04/21/2023 JM: Patient returns for 1 year follow-up visit. Reports migraines initially well controlled on Emgality but gradually started losing effectiveness and migraines started to become more frequent. She has not been on Emgality over the past several months as difficulty getting refill.  She is currently having >15 migraine days per month. Use of sumatriptan and Phenergan at night seems to work well, will wake up without migraine but no benefit with migraines during the day.       History provided for reference purposes only Update 04/14/2022 JM: Patient returns for 31-month migraine follow-up.  She has remained on Emgality, reports currently 3 migraine days per month.  She did miss some injections back in April, noticed worsening of migraine headaches, has significantly improved since restarting over the past several months.  Use of sumatriptan and Phenergan for abortive therapy typically with resolution of migraine after laying down but has had a couple where she may still wake up with migraine and can last for a couple days.   UPDATE 09/30/2021 JM: Patient returns for chronic migraine headaches.  She was previously seen by Dr. Lucia Gaskins in 05/2020.  Due to insurance requirements, unable to remain on Ajovy and was required to trial Emgality first.  She recently started [facility], has completed 2 injections  and due to next injection soon. She does believe she had greater benefit on Ajovy but is aware she needs to give Emgality some time.  Over the past month, she reports approximately 5 migraines lasting  anywhere from 24 to 72 hours.  Use of Imitrex for migrainous headaches, is not beneficial during the day but will take at night in combination with Phenergan and 50% of the time will wake up without residual migraine. She does have occasional mild non migrainous headaches.   Interval history 06/01/2020 Dr. Lucia Gaskins: Patient with chronic migraines was started on Ajovy. Here for follow up. Baseline was 23 headache days out of the month, and 10 -12 moderately severe to severe migraines a month. Now 10 headache days which includes 5 migraine days. And imitrex helps acutely. Nurtec didn't help. She is extremely happy.  MRI brain 02/13/2020:   IMPRESSION: This MRI of the brain with and without contrast shows the following: 1.   The brain appears normal before and after contrast. 2.   Mild maxillary and ethmoid chronic sinusitis. 3.   Left mastoid effusion.  This could be due to eustachian tube dysfunction. 4.   There is a normal enhancement pattern no acute findings.  Consult visit 01/28/2020 Dr. Lucia Gaskins:  Grayce Budden is a 32 y.o. female here as requested by Bradd Canary, MD for migraines. She has a PMHx of POTS, migraines. I reviewed Dr. Mariel Aloe notes: Patient was last seen for annual preventative exam, this was at the end of June of this year, no recent illnesses, she is feeling well, continue to stay active and maintaining a healthy diet, she discussed migraine headaches with Dr. Abner Greenspan who encouraged increased hydration, minimizing alcohol and caffeine, eating small frequent meals with lean proteins and complex carbs and avoiding  high and low blood sugars, getting adequate sleep 78 hours a night, and exercising.  Physical examination was normal.  For her migraines it appears she is on Imitrex 100 mg and propranolol 10 mg.  I reviewed TSH, CMP and CBC which were all normal.  I do not see that patient had brain imaging in the notes I have available.  She has had migraines since she was a kid, she reports  having them in the fourth grade and that is when they started to be migraines and not just headaches, as she is gotten older they have gotten more frequent and more severe, she is tried multiple medications if is just a headache analgesics little help if it is a migraine over-the-counter meds will not work, she has had a headache since Friday.Patient's migraines start behind te eyes maybe unilateral but either side, behind her ears and the top of the neck, temples, pulsating/pounding and throbbing and sharper, nausea, light and sound sensitivity, they can last 24-72 hours, sumatriptan may help if she sleeps, she can wake up in the morning with headaches, vision changes and blurry vision, slowly progressive through the years. Over the last year it has worsened and she has 23 headache days out of the month, and 10 -12 moderately severe to severe migraines a month. No medication overuse. No aura. She has tried to narrow down triggers, better when she is exercing but she takes continuous birth control so not menstrual, she tried cutting out caffeine, at work they seem to be worse, blistering migraine severe, she tries to drink, she tried to eat well. No family history of migraines. No other focal neurologic deficits, associated symptoms, inciting events or modifiable factors.  From a thorough review of records, meds tried that can be used in migraine prevention include: Tylenol, propranolol, Imitrex, ibuprofen, labetalol, metoprolol, Zofran injections and tablets, Percocet, prednisone tablets, Phenergan tablets, rizatriptan,  Reviewed notes, labs and imaging from outside physicians, which showed: see above    Review of Systems: Patient complains of symptoms per HPI as well as the following symptoms: headache . Pertinent negatives and positives per HPI. All others negative    Social History   Socioeconomic History   Marital status: Married    Spouse name: Not on file   Number of children: Not on file    Years of education: Not on file   Highest education level: Not on file  Occupational History   Occupation: Registered Nurse  Tobacco Use   Smoking status: Former   Smokeless tobacco: Never  Vaping Use   Vaping status: Never Used  Substance and Sexual Activity   Alcohol use: No   Drug use: No   Sexual activity: Yes  Other Topics Concern   Not on file  Social History Narrative   Lives with husband, son    Works with [employer], RN 3[employer]   No dietary restrictions.   Caffeine: 1 cup coffee in the morning and maybe once a week she has a coke.   Social Determinants of Health   Financial Resource Strain: Not on file  Food Insecurity: Not on file  Transportation Needs: Not on file  Physical Activity: Not on file  Stress: Not on file  Social Connections: Not on file  Intimate Partner Violence: Not on file    Family History  Problem Relation Age of Onset   Hyperlipidemia Mother    Diabetes Mother    Obesity Mother        s/p gastric bypass  Hypertension Mother    Depression Mother    Obesity Father    Hypertension Father    Heart disease Father        s/p MI in 2014, MI   Alcohol abuse Father    Dementia Father    Alcoholism Father    Hyperthyroidism Sister    Thyroid disease Sister        hyper   Diabetes Maternal Grandmother    Thyroid disease Maternal Grandmother        hypo, hyper   Hypertension Maternal Grandmother    Diabetes Maternal Grandfather    Hypertension Maternal Grandfather    Migraines Neg Hx     Past Medical History:  Diagnosis Date   Anemia    Back pain    Endometriosis    Lactose intolerance    Migraines    Orthostatic hypotension    POTS (postural orthostatic tachycardia syndrome)    Subchorionic hematoma     Patient Active Problem List   Diagnosis Date Noted   Vitamin D deficiency 08/18/2022   Class 2 obesity with body mass index (BMI) of 36.0 to 36.9 in adult 01/06/2021   Insomnia 01/06/2021   Depression with anxiety 01/06/2021    Sun-damaged skin 01/07/2020   Low back pain 04/28/2018   Migraine headache 07/08/2017   Sinus tachycardia 06/18/2016   Preventative health care 05/16/2016   Hypoglycemia 05/10/2016   POTS (postural orthostatic tachycardia syndrome)    Endometriosis     Past Surgical History:  Procedure Laterality Date   ENDOMETRIAL ABLATION     LAPAROSCOPIC OVARIAN CYSTECTOMY      Current Outpatient Medications  Medication Sig Dispense Refill   FLUoxetine (PROZAC) 20 MG tablet Take 1 tablet (20 mg total) by mouth daily. 30 tablet 3   Galcanezumab-gnlm (EMGALITY) 120 MG/ML SOAJ Inject 120 mg into the skin every 30 days. 1 mL 11   levonorgestrel-ethinyl estradiol (VIENVA) 0.1-20 MG-MCG tablet Take 1 tablet by mouth every day (continuously) 84 tablet 4   Multiple Vitamin (MULTIVITAMIN PO) Take by mouth daily.     promethazine (PHENERGAN) 25 MG tablet TAKE 1/2 - 1 TABLET BY MOUTH EVERY 6 HOURS AS NEEDED FOR NAUSEA OR VOMITING. 20 tablet 11   propranolol (INDERAL) 10 MG tablet Take 1 tablet (10 mg total) by mouth 2 (two) times daily. 180 tablet 3   SUMAtriptan (IMITREX) 100 MG tablet TAKE 1 TABLET BY MOUTH AS NEEDED FOR MIGRAINE, MAY REPEAT IN 2 HOURS IF HEADACHE PERSISTS OR RECURS 10 tablet 11   busPIRone (BUSPAR) 5 MG tablet Take 1 tablet by mouth 3 times daily as needed (anxiety). (Patient not taking: Reported on 08/31/2022) 90 tablet 0   Vitamin D, Ergocalciferol, (DRISDOL) 1.25 MG (50000 UNIT) CAPS capsule Take 1 capsule by mouth every 7 days. (Patient not taking: Reported on 04/21/2023) 4 capsule 0   No current facility-administered medications for this visit.    Allergies as of 04/21/2023   (No Known Allergies)    Vitals: Today's Vitals   04/21/23 1338  BP: 130/73  Pulse: 69  Weight: 200 lb (90.7 kg)  Height: 5\' 4"  (1.626 m)   Body mass index is 34.33 kg/m.   Exam: NAD, pleasant, young Caucasian female                 Speech:    Speech is normal; fluent and spontaneous with normal  comprehension.  Cognition:    The patient is oriented to person, place, and time;  recent and remote memory intact;     language fluent;    Cranial Nerves:    The pupils are equal, round, and reactive to light. The face is symmetric. The palate elevates in the midline. Hearing intact. Voice is normal. Shoulder shrug is normal. The tongue has normal motion without fasciculations.   Coordination:  No dysmetria  Motor Observation:    No asymmetry, no atrophy, and no involuntary movements noted. Tone:    Normal muscle tone.     Strength:    Strength is V/V in the upper and lower limbs.      Sensation: intact to LT       Assessment/Plan:  32 year old with chronic migraines, MRI brain unremarkable. Here for follow up.  Migraines initially well-controlled on Emgality but gradually lost effectiveness. She would be interested in pursuing Botox.  Sumatriptan and Phenergan with benefit with nighttime migraines but no benefit during the day.    Preventative: Start process for Botox Acute management: try Bernita Raisin, if beneficial will send in prescription.  Continue Imitrex and Phenergan as needed, advised not to use Ubrelvy and sumatriptan together Tried/failed: Preventative: Propranolol, Labetalol, Metoprolol, fluoxetine, gabapentin, Emgality, Ajovy Rescue: Sumatriptan, rizatriptan, Zofran, Percocet, prednisone      CC:  Bradd Canary, MD   I spent 25 minutes of face-to-face and non-face-to-face time with patient.  This included previsit chart review, lab review, study review, order entry, electronic health record documentation, patient education and discussion regarding above diagnoses and treatment plan and answered all other questions to patient's satisfaction   Ihor Austin, [facility]  [facility] [address] [place], [place] [address]  Phone [phone] Fax [phone] Note: This document was prepared with digital dictation and  possible smart phrase technology. Any transcriptional errors that result from this process are unintentional.

## 2023-04-22 ENCOUNTER — Other Ambulatory Visit: Payer: Self-pay

## 2023-04-25 ENCOUNTER — Telehealth: Payer: Self-pay | Admitting: Adult Health

## 2023-04-25 ENCOUNTER — Encounter: Payer: Self-pay | Admitting: Adult Health

## 2023-04-25 NOTE — Telephone Encounter (Signed)
Received new start form for Botox. I completed [employer] new start form and placed in nurse pod for NP signature. Also signed pt up for the Botox Savings Program.

## 2023-04-27 NOTE — Telephone Encounter (Signed)
Faxed signed PA form along with OV notes to [facility] @ [phone].

## 2023-04-28 NOTE — Telephone Encounter (Signed)
Received approval from [facility] for buy/bill.  Auth#: [id number] (04/27/23-10/12/23)

## 2023-05-03 ENCOUNTER — Other Ambulatory Visit (HOSPITAL_COMMUNITY): Payer: Self-pay

## 2023-05-03 ENCOUNTER — Encounter: Payer: Self-pay | Admitting: Adult Health

## 2023-05-03 MED ORDER — METHYLPREDNISOLONE 4 MG PO TBPK
ORAL_TABLET | ORAL | 0 refills | Status: DC
  Filled 2023-05-03 – 2023-05-04 (×2): qty 21, 6d supply, fill #0

## 2023-05-03 NOTE — Addendum Note (Signed)
Addended by: Ihor Austin L on: 05/03/2023 04:50 PM   Modules accepted: Orders

## 2023-05-04 ENCOUNTER — Other Ambulatory Visit (HOSPITAL_COMMUNITY): Payer: Self-pay

## 2023-05-04 MED ORDER — NARATRIPTAN HCL 2.5 MG PO TABS
2.5000 mg | ORAL_TABLET | ORAL | 11 refills | Status: DC | PRN
  Filled 2023-05-04: qty 9, 15d supply, fill #0
  Filled 2023-06-23: qty 9, 15d supply, fill #1

## 2023-05-04 NOTE — Addendum Note (Signed)
Addended by: Ihor Austin L on: 05/04/2023 04:27 PM   Modules accepted: Orders

## 2023-05-05 ENCOUNTER — Other Ambulatory Visit (HOSPITAL_COMMUNITY): Payer: Self-pay

## 2023-05-05 ENCOUNTER — Other Ambulatory Visit: Payer: Self-pay

## 2023-05-11 ENCOUNTER — Other Ambulatory Visit (HOSPITAL_COMMUNITY): Payer: Self-pay

## 2023-05-12 ENCOUNTER — Ambulatory Visit: Payer: BC Managed Care – PPO | Admitting: Family Medicine

## 2023-05-16 ENCOUNTER — Encounter: Payer: Self-pay | Admitting: Family Medicine

## 2023-05-20 DIAGNOSIS — G43911 Migraine, unspecified, intractable, with status migrainosus: Secondary | ICD-10-CM | POA: Diagnosis not present

## 2023-05-24 ENCOUNTER — Other Ambulatory Visit (HOSPITAL_COMMUNITY): Payer: Self-pay

## 2023-05-25 ENCOUNTER — Ambulatory Visit: Payer: BC Managed Care – PPO | Admitting: Adult Health

## 2023-05-25 DIAGNOSIS — G43709 Chronic migraine without aura, not intractable, without status migrainosus: Secondary | ICD-10-CM

## 2023-05-25 MED ORDER — ONABOTULINUMTOXINA 200 UNITS IJ SOLR
155.0000 [IU] | Freq: Once | INTRAMUSCULAR | Status: AC
Start: 2023-05-25 — End: 2023-05-25
  Administered 2023-05-25: 155 [IU] via INTRAMUSCULAR

## 2023-05-25 NOTE — Progress Notes (Signed)
Patient is being seen for initial Botox injection.  Continues to experience > 15 migraines per month. Had recent prolonged migraine, no benefit with Medrol Dosepak, and represented to urgent care for migraine cocktail with benefit, still has mild headache behind right eye and left occipital area and has been gradually improving.  Denies any benefit with naratriptan but also sumatriptan without benefit which was previously helpful (but only at nighttime).  Willing to retry naratriptan, will notify if not beneficial for other rescue therapy options.  Tolerated procedure well today.  Will return in 3 months for repeat injection       Consent Form Botulism Toxin Injection For Chronic Migraine    Reviewed orally with patient, additionally signature is on file:  Botulism toxin has been approved by the Federal drug administration for treatment of chronic migraine. Botulism toxin does not cure chronic migraine and it may not be effective in some patients.  The administration of botulism toxin is accomplished by injecting a small amount of toxin into the muscles of the neck and head. Dosage must be titrated for each individual. Any benefits resulting from botulism toxin tend to wear off after 3 months with a repeat injection required if benefit is to be maintained. Injections are usually done every 3-4 months with maximum effect peak achieved by about 2 or 3 weeks. Botulism toxin is expensive and you should be sure of what costs you will incur resulting from the injection.  The side effects of botulism toxin use for chronic migraine may include:   -Transient, and usually mild, facial weakness with facial injections  -Transient, and usually mild, head or neck weakness with head/neck injections  -Reduction or loss of forehead facial animation due to forehead muscle weakness  -Eyelid drooping  -Dry eye  -Pain at the site of injection or bruising at the site of injection  -Double  vision  -Potential unknown long term risks   Contraindications: You should not have Botox if you are pregnant, nursing, allergic to albumin, have an infection, skin condition, or muscle weakness at the site of the injection, or have myasthenia gravis, Lambert-Eaton syndrome, or ALS.  It is also possible that as with any injection, there may be an allergic reaction or no effect from the medication. Reduced effectiveness after repeated injections is sometimes seen and rarely infection at the injection site may occur. All care will be taken to prevent these side effects. If therapy is given over a long time, atrophy and wasting in the muscle injected may occur. Occasionally the patient's become refractory to treatment because they develop antibodies to the toxin. In this event, therapy needs to be modified.  I have read the above information and consent to the administration of botulism toxin.    BOTOX PROCEDURE NOTE FOR MIGRAINE HEADACHE  Contraindications and precautions discussed with patient(above). Aseptic procedure was observed and patient tolerated procedure. Procedure performed by Ihor Austin, AGNP-BC.   The condition has existed for more than 6 months, and pt does not have a diagnosis of ALS, Myasthenia Gravis or Lambert-Eaton Syndrome.  Risks and benefits of injections discussed and pt agrees to proceed with the procedure.  Written consent obtained  These injections are medically necessary. Pt  receives good benefits from these injections. These injections do not cause sedations or hallucinations which the oral therapies may cause.   Description of procedure:  The patient was placed in a sitting position. The standard protocol was used for Botox as follows, with 5 units  of Botox injected at each site:  -Procerus muscle, midline injection  -Corrugator muscle, bilateral injection  -Frontalis muscle, bilateral injection, with 2 sites each side, medial injection was performed in the  upper one third of the frontalis muscle, in the region vertical from the medial inferior edge of the superior orbital rim. The lateral injection was again in the upper one third of the forehead vertically above the lateral limbus of the cornea, 1.5 cm lateral to the medial injection site.  -Temporalis muscle injection, 4 sites, bilaterally. The first injection was 3 cm above the tragus of the ear, second injection site was 1.5 cm to 3 cm up from the first injection site in line with the tragus of the ear. The third injection site was 1.5-3 cm forward between the first 2 injection sites. The fourth injection site was 1.5 cm posterior to the second injection site. 5th site laterally in the temporalis  muscleat the level of the outer canthus.  -Occipitalis muscle injection, 3 sites, bilaterally. The first injection was done one half way between the occipital protuberance and the tip of the mastoid process behind the ear. The second injection site was done lateral and superior to the first, 1 fingerbreadth from the first injection. The third injection site was 1 fingerbreadth superiorly and medially from the first injection site.  -Cervical paraspinal muscle injection, 2 sites, bilaterally. The first injection site was 1 cm from the midline of the cervical spine, 3 cm inferior to the lower border of the occipital protuberance. The second injection site was 1.5 cm superiorly and laterally to the first injection site.  -Trapezius muscle injection was performed at 3 sites, bilaterally. The first injection site was in the upper trapezius muscle halfway between the inflection point of the neck, and the acromion. The second injection site was one half way between the acromion and the first injection site. The third injection was done between the first injection site and the inflection point of the neck.    A total of 200 units of Botox was prepared, 155 units of Botox was injected as documented above, any Botox  not injected was wasted. The patient tolerated the procedure well, there were no complications of the above procedure.   Ihor Austin, AGNP-BC  [facility] [address] [place], [place] [address]  Phone [phone] Fax [phone] Note: This document was prepared with digital dictation and possible smart phrase technology. Any transcriptional errors that result from this process are unintentional.

## 2023-05-25 NOTE — Progress Notes (Signed)
Botox- 200 units x 1 vial Lot: [id number] Expiration: 03/2025 NDC: 1478-2956-21  Bacteriostatic 0.9% Sodium Chloride- * mL  Lot: [id number] Expiration: 06/24/2023 NDC: 8469-6295-28  Dx: U13.244  B/B Witnessed by Berneice Gandy, CMA

## 2023-06-24 ENCOUNTER — Other Ambulatory Visit: Payer: Self-pay

## 2023-06-26 NOTE — Assessment & Plan Note (Signed)
Encouraged increased hydration, 64 ounces of clear fluids daily. Minimize alcohol and caffeine. Eat small frequent meals with lean proteins and complex carbs. Avoid high and low blood sugars. Get adequate sleep, 7-8 hours a night. Needs exercise daily preferably in the morning. Continue prn meds

## 2023-06-26 NOTE — Assessment & Plan Note (Signed)
Supplement and monitor 

## 2023-06-26 NOTE — Assessment & Plan Note (Signed)
Encouraged good sleep hygiene such as dark, quiet room. No blue/green glowing lights such as computer screens in bedroom. No alcohol or stimulants in evening. Cut down on caffeine as able. Regular exercise is helpful but not just prior to bed time.  

## 2023-06-27 ENCOUNTER — Telehealth: Payer: BC Managed Care – PPO | Admitting: Family Medicine

## 2023-06-27 ENCOUNTER — Other Ambulatory Visit (HOSPITAL_COMMUNITY): Payer: Self-pay

## 2023-06-27 ENCOUNTER — Encounter: Payer: Self-pay | Admitting: Family Medicine

## 2023-06-27 VITALS — BP 117/68 | HR 86 | Temp 97.9°F

## 2023-06-27 DIAGNOSIS — G43909 Migraine, unspecified, not intractable, without status migrainosus: Secondary | ICD-10-CM

## 2023-06-27 DIAGNOSIS — E559 Vitamin D deficiency, unspecified: Secondary | ICD-10-CM | POA: Diagnosis not present

## 2023-06-27 DIAGNOSIS — G4700 Insomnia, unspecified: Secondary | ICD-10-CM | POA: Diagnosis not present

## 2023-06-27 DIAGNOSIS — F418 Other specified anxiety disorders: Secondary | ICD-10-CM

## 2023-06-27 MED ORDER — HYDROXYZINE HCL 10 MG PO TABS
5.0000 mg | ORAL_TABLET | Freq: Three times a day (TID) | ORAL | 3 refills | Status: AC | PRN
Start: 2023-06-27 — End: ?
  Filled 2023-06-27: qty 30, 5d supply, fill #0
  Filled 2023-12-13: qty 30, 5d supply, fill #1

## 2023-06-27 MED ORDER — SERTRALINE HCL 50 MG PO TABS
50.0000 mg | ORAL_TABLET | Freq: Every day | ORAL | 3 refills | Status: DC
  Filled 2023-06-27: qty 30, 30d supply, fill #0
  Filled 2023-10-23: qty 30, 30d supply, fill #1
  Filled 2023-11-18: qty 30, 30d supply, fill #2
  Filled 2023-12-18: qty 30, 30d supply, fill #3
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
## 2023-06-27 NOTE — Assessment & Plan Note (Signed)
Struggling with anhedonia and anxiety as the trials to adjudicate her son's illness's starts in February. Discussed options and made plan of care for over 25 minutes

## 2023-08-17 ENCOUNTER — Ambulatory Visit: Payer: BC Managed Care – PPO | Admitting: Adult Health

## 2023-08-17 DIAGNOSIS — G43709 Chronic migraine without aura, not intractable, without status migrainosus: Secondary | ICD-10-CM

## 2023-08-17 MED ORDER — ONABOTULINUMTOXINA 200 UNITS IJ SOLR
155.0000 [IU] | Freq: Once | INTRAMUSCULAR | Status: AC
Start: 2023-08-17 — End: 2023-08-17
  Administered 2023-08-17: 155 [IU] via INTRAMUSCULAR

## 2023-08-17 NOTE — Progress Notes (Signed)
Update 08/17/2023 JM: returns for second injection. Prior injection 05/25/23.  She reports significant benefit after initial Botox injection currently experiencing about 3 migraines per month (previously >15 migraine days per month). Use of either sumatriptan or naratriptan with benefit, usually will use whichever one is available but has noticed greater benefit with naratriptan.  Tolerated procedure well today.  Return in 3 months for repeat injection.     Consent Form Botulism Toxin Injection For Chronic Migraine    Reviewed orally with patient, additionally signature is on file:  Botulism toxin has been approved by the Federal drug administration for treatment of chronic migraine. Botulism toxin does not cure chronic migraine and it may not be effective in some patients.  The administration of botulism toxin is accomplished by injecting a small amount of toxin into the muscles of the neck and head. Dosage must be titrated for each individual. Any benefits resulting from botulism toxin tend to wear off after 3 months with a repeat injection required if benefit is to be maintained. Injections are usually done every 3-4 months with maximum effect peak achieved by about 2 or 3 weeks. Botulism toxin is expensive and you should be sure of what costs you will incur resulting from the injection.  The side effects of botulism toxin use for chronic migraine may include:   -Transient, and usually mild, facial weakness with facial injections  -Transient, and usually mild, head or neck weakness with head/neck injections  -Reduction or loss of forehead facial animation due to forehead muscle weakness  -Eyelid drooping  -Dry eye  -Pain at the site of injection or bruising at the site of injection  -Double vision  -Potential unknown long term risks   Contraindications: You should not have Botox if you are pregnant, nursing, allergic to albumin, have an infection, skin condition, or muscle  weakness at the site of the injection, or have myasthenia gravis, Lambert-Eaton syndrome, or ALS.  It is also possible that as with any injection, there may be an allergic reaction or no effect from the medication. Reduced effectiveness after repeated injections is sometimes seen and rarely infection at the injection site may occur. All care will be taken to prevent these side effects. If therapy is given over a long time, atrophy and wasting in the muscle injected may occur. Occasionally the patient's become refractory to treatment because they develop antibodies to the toxin. In this event, therapy needs to be modified.  I have read the above information and consent to the administration of botulism toxin.    BOTOX PROCEDURE NOTE FOR MIGRAINE HEADACHE  Contraindications and precautions discussed with patient(above). Aseptic procedure was observed and patient tolerated procedure. Procedure performed by Ihor Austin, AGNP-BC.   The condition has existed for more than 6 months, and pt does not have a diagnosis of ALS, Myasthenia Gravis or Lambert-Eaton Syndrome.  Risks and benefits of injections discussed and pt agrees to proceed with the procedure.  Written consent obtained  These injections are medically necessary. Pt  receives good benefits from these injections. These injections do not cause sedations or hallucinations which the oral therapies may cause.   Description of procedure:  The patient was placed in a sitting position. The standard protocol was used for Botox as follows, with 5 units of Botox injected at each site:  -Procerus muscle, midline injection  -Corrugator muscle, bilateral injection  -Frontalis muscle, bilateral injection, with 2 sites each side, medial injection was performed in the upper one third  of the frontalis muscle, in the region vertical from the medial inferior edge of the superior orbital rim. The lateral injection was again in the upper one third of the  forehead vertically above the lateral limbus of the cornea, 1.5 cm lateral to the medial injection site.  -Temporalis muscle injection, 4 sites, bilaterally. The first injection was 3 cm above the tragus of the ear, second injection site was 1.5 cm to 3 cm up from the first injection site in line with the tragus of the ear. The third injection site was 1.5-3 cm forward between the first 2 injection sites. The fourth injection site was 1.5 cm posterior to the second injection site. 5th site laterally in the temporalis  muscleat the level of the outer canthus.  -Occipitalis muscle injection, 3 sites, bilaterally. The first injection was done one half way between the occipital protuberance and the tip of the mastoid process behind the ear. The second injection site was done lateral and superior to the first, 1 fingerbreadth from the first injection. The third injection site was 1 fingerbreadth superiorly and medially from the first injection site.  -Cervical paraspinal muscle injection, 2 sites, bilaterally. The first injection site was 1 cm from the midline of the cervical spine, 3 cm inferior to the lower border of the occipital protuberance. The second injection site was 1.5 cm superiorly and laterally to the first injection site.  -Trapezius muscle injection was performed at 3 sites, bilaterally. The first injection site was in the upper trapezius muscle halfway between the inflection point of the neck, and the acromion. The second injection site was one half way between the acromion and the first injection site. The third injection was done between the first injection site and the inflection point of the neck.    A total of 200 units of Botox was prepared, 155 units of Botox was injected as documented above, any Botox not injected was wasted. The patient tolerated the procedure well, there were no complications of the above procedure.   Ihor Austin, AGNP-BC  [facility] [address] [place], [place] [address]  Phone [phone] Fax [phone] Note: This document was prepared with digital dictation and possible smart phrase technology. Any transcriptional errors that result from this process are unintentional.

## 2023-08-17 NOTE — Progress Notes (Signed)
Botox- 100 units x 2 vials Lot: [id number] Expiration: 11/2025 NDC: 4782-9562-13  Bacteriostatic 0.9% Sodium Chloride- 2 mL  Lot: [id number] Expiration: 05/05/2024 NDC: 4696-2952-84  Dx: X32.440 B/B Witnessed by: Katherine Mantle

## 2023-08-20 ENCOUNTER — Other Ambulatory Visit (HOSPITAL_COMMUNITY): Payer: Self-pay

## 2023-08-20 ENCOUNTER — Other Ambulatory Visit: Payer: Self-pay | Admitting: Family Medicine

## 2023-08-20 DIAGNOSIS — R000 Tachycardia, unspecified: Secondary | ICD-10-CM

## 2023-08-22 ENCOUNTER — Other Ambulatory Visit (HOSPITAL_COMMUNITY): Payer: Self-pay

## 2023-08-22 MED ORDER — PROPRANOLOL HCL 10 MG PO TABS
10.0000 mg | ORAL_TABLET | Freq: Two times a day (BID) | ORAL | 3 refills | Status: DC
  Filled 2023-08-22: qty 180, 90d supply, fill #0
  Filled 2023-11-18: qty 180, 90d supply, fill #1
  Filled 2024-02-14: qty 180, 90d supply, fill #2
  Filled 2024-05-22: qty 180, 90d supply, fill #3

## 2023-08-28 DIAGNOSIS — E785 Hyperlipidemia, unspecified: Secondary | ICD-10-CM | POA: Insufficient documentation

## 2023-08-28 NOTE — Assessment & Plan Note (Signed)
 Sertraline 50 mg daily and Hydroxyzine prn

## 2023-08-28 NOTE — Assessment & Plan Note (Signed)
 Supplement and monitor

## 2023-08-28 NOTE — Assessment & Plan Note (Signed)
 Encourage heart healthy diet such as MIND or DASH diet, increase exercise, avoid trans fats, simple carbohydrates and processed foods, consider a krill or fish or flaxseed oil cap daily.

## 2023-08-28 NOTE — Assessment & Plan Note (Signed)
 Encouraged increased hydration, 64 ounces of clear fluids daily. Minimize alcohol and caffeine. Eat small frequent meals with lean proteins and complex carbs. Avoid high and low blood sugars. Get adequate sleep, 7-8 hours a night. Needs exercise daily preferably in the morning. Continue prn meds

## 2023-08-28 NOTE — Assessment & Plan Note (Addendum)
 Encouraged DASH or MIND diet, decrease po intake and increase exercise as tolerated. Needs 7-8 hours of sleep nightly. Avoid trans fats, eat small, frequent meals every 4-5 hours with lean proteins, complex carbs and healthy fats. Minimize simple carbs, high fat foods and processed foods

## 2023-08-29 ENCOUNTER — Ambulatory Visit: Payer: BC Managed Care – PPO | Admitting: Family Medicine

## 2023-08-29 VITALS — BP 108/62 | HR 90 | Temp 98.1°F | Resp 18 | Ht 63.0 in | Wt 200.3 lb

## 2023-08-29 DIAGNOSIS — Z6836 Body mass index (BMI) 36.0-36.9, adult: Secondary | ICD-10-CM

## 2023-08-29 DIAGNOSIS — E785 Hyperlipidemia, unspecified: Secondary | ICD-10-CM | POA: Diagnosis not present

## 2023-08-29 DIAGNOSIS — G43909 Migraine, unspecified, not intractable, without status migrainosus: Secondary | ICD-10-CM | POA: Diagnosis not present

## 2023-08-29 DIAGNOSIS — E559 Vitamin D deficiency, unspecified: Secondary | ICD-10-CM | POA: Diagnosis not present

## 2023-08-29 DIAGNOSIS — F418 Other specified anxiety disorders: Secondary | ICD-10-CM

## 2023-08-29 DIAGNOSIS — Z0289 Encounter for other administrative examinations: Secondary | ICD-10-CM

## 2023-08-29 DIAGNOSIS — E66812 Obesity, class 2: Secondary | ICD-10-CM

## 2023-08-29 DIAGNOSIS — E669 Obesity, unspecified: Secondary | ICD-10-CM

## 2023-08-29 LAB — CBC WITH DIFFERENTIAL/PLATELET
Basophils Absolute: 0 10*3/uL (ref 0.0–0.1)
Basophils Relative: 0.2 % (ref 0.0–3.0)
Eosinophils Absolute: 0.2 10*3/uL (ref 0.0–0.7)
Eosinophils Relative: 1.7 % (ref 0.0–5.0)
HCT: 40.9 % (ref 36.0–46.0)
Hemoglobin: 13.7 g/dL (ref 12.0–15.0)
Lymphocytes Relative: 29.6 % (ref 12.0–46.0)
Lymphs Abs: 3 10*3/uL (ref 0.7–4.0)
MCHC: 33.5 g/dL (ref 30.0–36.0)
MCV: 89.4 fl (ref 78.0–100.0)
Monocytes Absolute: 0.8 10*3/uL (ref 0.1–1.0)
Monocytes Relative: 7.6 % (ref 3.0–12.0)
Neutro Abs: 6.1 10*3/uL (ref 1.4–7.7)
Neutrophils Relative %: 60.9 % (ref 43.0–77.0)
Platelets: 375 10*3/uL (ref 150.0–400.0)
RBC: 4.58 Mil/uL (ref 3.87–5.11)
RDW: 13.3 % (ref 11.5–15.5)
WBC: 10.1 10*3/uL (ref 4.0–10.5)

## 2023-08-29 LAB — TSH: TSH: 2.22 u[IU]/mL (ref 0.35–5.50)

## 2023-08-29 LAB — VITAMIN D 25 HYDROXY (VIT D DEFICIENCY, FRACTURES): VITD: 51.51 ng/mL (ref 30.00–100.00)

## 2023-08-29 LAB — HEMOGLOBIN A1C: Hgb A1c MFr Bld: 5.4 % (ref 4.6–6.5)

## 2023-08-29 NOTE — Patient Instructions (Addendum)
 [employer] lighting for seasonal affective disorder. [employer] company for supplements at [employer]

## 2023-08-30 ENCOUNTER — Encounter: Payer: Self-pay | Admitting: Family Medicine

## 2023-08-30 LAB — LIPID PANEL
Cholesterol: 197 mg/dL (ref 0–200)
HDL: 55.5 mg/dL (ref >39.00)
LDL Cholesterol: 124 mg/dL — ABNORMAL HIGH (ref 0–99)
NonHDL: 141.25
Total CHOL/HDL Ratio: 4
Triglycerides: 87 mg/dL (ref 0.0–149.0)
VLDL: 17.4 mg/dL (ref 0.0–40.0)

## 2023-08-30 LAB — COMPREHENSIVE METABOLIC PANEL
ALT: 12 U/L (ref 0–35)
AST: 15 U/L (ref 0–37)
Albumin: 4 g/dL (ref 3.5–5.2)
Alkaline Phosphatase: 51 U/L (ref 39–117)
BUN: 13 mg/dL (ref 6–23)
CO2: 23 meq/L (ref 19–32)
Calcium: 8.7 mg/dL (ref 8.4–10.5)
Chloride: 102 meq/L (ref 96–112)
Creatinine, Ser: 0.78 mg/dL (ref 0.40–1.20)
GFR: 101.56 mL/min (ref >60.00)
Glucose, Bld: 83 mg/dL (ref 70–99)
Potassium: 4.2 meq/L (ref 3.5–5.1)
Sodium: 136 meq/L (ref 135–145)
Total Bilirubin: 0.4 mg/dL (ref 0.2–1.2)
Total Protein: 6.6 g/dL (ref 6.0–8.3)

## 2023-08-30 NOTE — Progress Notes (Signed)
 Subjective:    Patient ID: Jean Phillips, female    DOB: 17-Apr-1991, 33 y.o.   MRN: [id number]  Chief Complaint  Patient presents with  . Follow-up    HPI Discussed the use of AI scribe software for clinical note transcription with the patient, who gave verbal consent to proceed.  History of Present Illness Jean Phillips is a 33 year old female who presents for management of migraines and weight concerns.  She is undergoing management for migraines with Botox treatment, having completed two rounds. The first dose provided significant relief, and while her current headache is not a full migraine, it remains bothersome. She is hopeful for further improvement with continued treatment.  She is focused on weight management, having previously lost weight with the [employer] program, which she found unsustainable. Recently, she experienced a rebound in weight but is now concentrating on reducing processed foods and increasing whole foods in her diet. She is motivated by the desire to set a good example for her child, Jean Phillips.  She has a history of endometriosis, which she describes as a significant burden due to its inflammatory nature. She is aware that her current use of oral contraceptive pills may affect the interpretation of her lab results, particularly hormone levels.    Past Medical History:  Diagnosis Date  . Anemia   . Back pain   . Endometriosis   . Lactose intolerance   . Migraines   . Orthostatic hypotension   . POTS (postural orthostatic tachycardia syndrome)   . Subchorionic hematoma     Past Surgical History:  Procedure Laterality Date  . ENDOMETRIAL ABLATION    . LAPAROSCOPIC OVARIAN CYSTECTOMY      Family History  Problem Relation Age of Onset  . Hyperlipidemia Mother   . Diabetes Mother   . Obesity Mother        s/p gastric bypass  . Hypertension Mother   . Depression Mother   . Obesity Father   . Hypertension Father   . Heart disease Father        s/p MI  in 2014, MI  . Alcohol abuse Father   . Dementia Father   . Alcoholism Father   . Hyperthyroidism Sister   . Thyroid disease Sister        hyper  . Diabetes Maternal Grandmother   . Thyroid disease Maternal Grandmother        hypo, hyper  . Hypertension Maternal Grandmother   . Diabetes Maternal Grandfather   . Hypertension Maternal Grandfather   . Migraines Neg Hx     Social History   Socioeconomic History  . Marital status: Married    Spouse name: Not on file  . Number of children: Not on file  . Years of education: Not on file  . Highest education level: Master's degree (e.g., MA, MS, MEng, MEd, MSW, MBA)  Occupational History  . Occupation: Designer, jewellery  Tobacco Use  . Smoking status: Former  . Smokeless tobacco: Never  Vaping Use  . Vaping status: Never Used  Substance and Sexual Activity  . Alcohol use: No  . Drug use: No  . Sexual activity: Yes  Other Topics Concern  . Not on file  Social History Narrative   Lives with husband, son    Works with [employer], RN 3S   No dietary restrictions.   Caffeine: 1 cup coffee in the morning and maybe once a week she has a coke.   Social Drivers of Health  Financial Resource Strain: Low Risk  (08/28/2023)   Overall Financial Resource Strain (CARDIA)   . Difficulty of Paying Living Expenses: Not hard at all  Food Insecurity: No Food Insecurity (08/28/2023)   Hunger Vital Sign   . Worried About Programme researcher, broadcasting/film/video in the Last Year: Never true   . Ran Out of Food in the Last Year: Never true  Transportation Needs: No Transportation Needs (08/28/2023)   PRAPARE - Transportation   . Lack of Transportation (Medical): No   . Lack of Transportation (Non-Medical): No  Physical Activity: Sufficiently Active (08/28/2023)   Exercise Vital Sign   . Days of Exercise per Week: 3 days   . Minutes of Exercise per Session: 60 min  Stress: Stress Concern Present (08/28/2023)   [employer] of Occupational Health - Occupational  Stress Questionnaire   . Feeling of Stress : To some extent  Social Connections: Socially Integrated (08/28/2023)   Social Connection and Isolation Panel [NHANES]   . Frequency of Communication with Friends and Family: More than three times a week   . Frequency of Social Gatherings with Friends and Family: Three times a week   . Attends Religious Services: More than 4 times per year   . Active Member of Clubs or Organizations: Yes   . Attends Banker Meetings: More than 4 times per year   . Marital Status: Married  Catering manager Violence: Not on file    Outpatient Medications Prior to Visit  Medication Sig Dispense Refill  . hydrOXYzine (ATARAX) 10 MG tablet Take 0.5-2 tablets (5-20 mg total) by mouth 3 (three) times daily as needed. 30 tablet 3  . levonorgestrel-ethinyl estradiol (VIENVA) 0.1-20 MG-MCG tablet Take 1 tablet by mouth every day (continuously) 84 tablet 4  . Multiple Vitamin (MULTIVITAMIN PO) Take by mouth daily.    . naratriptan (AMERGE) 2.5 MG tablet Take 1 tablet (2.5 mg total) by mouth as needed for migraine at onset; if returns or does not resolve, may repeat after 4 hours; Max of 2 tabs/24 hrs 10 tablet 11  . promethazine (PHENERGAN) 25 MG tablet Take 0.5-1 tablets (12.5-25 mg total) by mouth every 6 (six) hours as needed for nausea and vomiting. 20 tablet 11  . propranolol (INDERAL) 10 MG tablet Take 1 tablet (10 mg total) by mouth 2 (two) times daily. 180 tablet 3  . sertraline (ZOLOFT) 50 MG tablet Take 1 tablet (50 mg total) by mouth daily. 30 tablet 3  . SUMAtriptan (IMITREX) 100 MG tablet Take 1 tablet (100 mg total) by mouth as needed for migraine.  May repeat in 2 hours if headache persists or recurs. 10 tablet 11  . FLUoxetine (PROZAC) 20 MG tablet Take 1 tablet (20 mg total) by mouth daily. 30 tablet 3   No facility-administered medications prior to visit.    No Known Allergies  Review of Systems  Constitutional:  Positive for  malaise/fatigue. Negative for fever.  HENT:  Negative for congestion.   Eyes:  Negative for blurred vision.  Respiratory:  Negative for shortness of breath.   Cardiovascular:  Negative for chest pain, palpitations and leg swelling.  Gastrointestinal:  Negative for abdominal pain, blood in stool and nausea.  Genitourinary:  Negative for dysuria and frequency.  Musculoskeletal:  Negative for falls.  Skin:  Negative for rash.  Neurological:  Positive for headaches. Negative for dizziness and loss of consciousness.  Endo/Heme/Allergies:  Negative for environmental allergies.  Psychiatric/Behavioral:  Negative for depression. The patient is not  nervous/anxious.       Objective:    Physical Exam Constitutional:      General: She is not in acute distress.    Appearance: Normal appearance. She is well-developed. She is not toxic-appearing.  HENT:     Head: Normocephalic and atraumatic.     Right Ear: External ear normal.     Left Ear: External ear normal.     Nose: Nose normal.  Eyes:     General:        Right eye: No discharge.        Left eye: No discharge.     Conjunctiva/sclera: Conjunctivae normal.  Neck:     Thyroid: No thyromegaly.  Cardiovascular:     Rate and Rhythm: Normal rate and regular rhythm.     Heart sounds: Normal heart sounds. No murmur heard. Pulmonary:     Effort: Pulmonary effort is normal. No respiratory distress.     Breath sounds: Normal breath sounds.  Abdominal:     General: Bowel sounds are normal.     Palpations: Abdomen is soft.     Tenderness: There is no abdominal tenderness. There is no guarding.  Musculoskeletal:        General: Normal range of motion.     Cervical back: Neck supple.  Lymphadenopathy:     Cervical: No cervical adenopathy.  Skin:    General: Skin is warm and dry.  Neurological:     Mental Status: She is alert and oriented to person, place, and time.  Psychiatric:        Mood and Affect: Mood normal.        Behavior:  Behavior normal.        Thought Content: Thought content normal.        Judgment: Judgment normal.   BP 108/62 (BP Location: Left Arm, Patient Position: Sitting, Cuff Size: Large)   Pulse 90   Temp 98.1 F (36.7 C) (Oral)   Resp 18   Ht 5\' 3"  (1.6 m)   Wt 200 lb 4.8 oz (90.9 kg)   SpO2 98%   BMI 35.48 kg/m  Wt Readings from Last 3 Encounters:  08/29/23 200 lb 4.8 oz (90.9 kg)  04/21/23 200 lb (90.7 kg)  08/18/22 207 lb 9.6 oz (94.2 kg)    Diabetic Foot Exam - Simple   No data filed    Lab Results  Component Value Date   WBC 10.1 08/29/2023   HGB 13.7 08/29/2023   HCT 40.9 08/29/2023   PLT 375.0 08/29/2023   GLUCOSE 83 08/29/2023   CHOL 197 08/29/2023   TRIG 87.0 08/29/2023   HDL 55.50 08/29/2023   LDLCALC 124 (H) 08/29/2023   ALT 12 08/29/2023   AST 15 08/29/2023   NA 136 08/29/2023   K 4.2 08/29/2023   CL 102 08/29/2023   CREATININE 0.78 08/29/2023   BUN 13 08/29/2023   CO2 23 08/29/2023   TSH 2.22 08/29/2023   HGBA1C 5.4 08/29/2023    Lab Results  Component Value Date   TSH 2.22 08/29/2023   Lab Results  Component Value Date   WBC 10.1 08/29/2023   HGB 13.7 08/29/2023   HCT 40.9 08/29/2023   MCV 89.4 08/29/2023   PLT 375.0 08/29/2023   Lab Results  Component Value Date   NA 136 08/29/2023   K 4.2 08/29/2023   CO2 23 08/29/2023   GLUCOSE 83 08/29/2023   BUN 13 08/29/2023   CREATININE 0.78 08/29/2023   BILITOT 0.4 08/29/2023  ALKPHOS 51 08/29/2023   AST 15 08/29/2023   ALT 12 08/29/2023   PROT 6.6 08/29/2023   ALBUMIN 4.0 08/29/2023   CALCIUM 8.7 08/29/2023   EGFR 105 03/03/2022   GFR 101.56 08/29/2023   Lab Results  Component Value Date   CHOL 197 08/29/2023   Lab Results  Component Value Date   HDL 55.50 08/29/2023   Lab Results  Component Value Date   LDLCALC 124 (H) 08/29/2023   Lab Results  Component Value Date   TRIG 87.0 08/29/2023   Lab Results  Component Value Date   CHOLHDL 4 08/29/2023   Lab Results   Component Value Date   HGBA1C 5.4 08/29/2023       Assessment & Plan:  Depression with anxiety Assessment & Plan: Sertraline 50 mg daily and Hydroxyzine prn  Orders: -     Hemoglobin A1c -     TSH  Migraine without status migrainosus, not intractable, unspecified migraine type Assessment & Plan: Encouraged increased hydration, 64 ounces of clear fluids daily. Minimize alcohol and caffeine. Eat small frequent meals with lean proteins and complex carbs. Avoid high and low blood sugars. Get adequate sleep, 7-8 hours a night. Needs exercise daily preferably in the morning. Continue prn meds  Orders: -     Comprehensive metabolic panel -     CBC with Differential/Platelet -     Hemoglobin A1c -     TSH  Vitamin D deficiency Assessment & Plan: Supplement and monitor   Orders: -     VITAMIN D 25 Hydroxy (Vit-D Deficiency, Fractures)  Hyperlipidemia, unspecified hyperlipidemia type Assessment & Plan: Encourage heart healthy diet such as MIND or DASH diet, increase exercise, avoid trans fats, simple carbohydrates and processed foods, consider a krill or fish or flaxseed oil cap daily.    Orders: -     Lipid panel -     Comprehensive metabolic panel -     Hemoglobin A1c -     TSH  Class 2 obesity with body mass index (BMI) of 36.0 to 36.9 in adult, unspecified obesity type, unspecified whether serious comorbidity present Assessment & Plan: Encouraged DASH or MIND diet, decrease po intake and increase exercise as tolerated. Needs 7-8 hours of sleep nightly. Avoid trans fats, eat small, frequent meals every 4-5 hours with lean proteins, complex carbs and healthy fats. Minimize simple carbs, high fat foods and processed foods.    Obesity (BMI 35.0-39.9 without comorbidity) -     Amb Ref to Medical Weight Management    Assessment and Plan Assessment & Plan Migraines   Improvement noted with Botox treatment. Second round of treatment recently administered and patient reports  significant relief.   -Continue Botox treatment as prescribed.  Irregular Menstrual Cycles   Patient currently on oral contraceptive pills (OCPs). Discussed the limited interpretive value of hormonal labs while on OCPs.   -Decision made not to proceed with hormonal labs as she is unlikely to provide clinically significant information.  Insulin Sensitivity   It has been a few years since the last insulin level was checked.   -Order insulin level to assess for development of insulin sensitivity.  Weight Management   Patient has had some success with weight loss but acknowledges recent poor dietary choices. Expressed interest in setting healthier, sustainable habits, particularly for the benefit of her child.   -Referral to [facility] for support and accountability in establishing healthier habits.  Seasonal Affective Disorder   Discussed the potential benefit  of light therapy.   -Recommended considering a light therapy device, such as those produced by [employer].  General Health Maintenance   -Physical scheduled for the fall.   -Consider starting sertraline for mood, with awareness of potential GI upset and 6-12 week onset of effect.   -Recommended NOW company for multivitamin and other supplement needs.     Danise Edge, MD

## 2023-09-19 ENCOUNTER — Other Ambulatory Visit (HOSPITAL_COMMUNITY): Payer: Self-pay

## 2023-09-21 ENCOUNTER — Encounter (INDEPENDENT_AMBULATORY_CARE_PROVIDER_SITE_OTHER): Payer: Self-pay

## 2023-09-26 ENCOUNTER — Ambulatory Visit: Payer: No Typology Code available for payment source | Admitting: Adult Health

## 2023-10-05 ENCOUNTER — Ambulatory Visit (INDEPENDENT_AMBULATORY_CARE_PROVIDER_SITE_OTHER): Payer: BC Managed Care – PPO | Admitting: Family Medicine

## 2023-10-06 ENCOUNTER — Encounter (INDEPENDENT_AMBULATORY_CARE_PROVIDER_SITE_OTHER): Payer: Self-pay

## 2023-10-17 ENCOUNTER — Telehealth: Payer: Self-pay | Admitting: Adult Health

## 2023-10-17 ENCOUNTER — Ambulatory Visit (INDEPENDENT_AMBULATORY_CARE_PROVIDER_SITE_OTHER): Payer: BC Managed Care – PPO | Admitting: Family Medicine

## 2023-10-17 NOTE — Telephone Encounter (Signed)
 Completed BCBS PA form and faxed with OV notes to [phone].

## 2023-10-17 NOTE — Telephone Encounter (Signed)
 Completed BCBS PA renewal form and placed in nurse pod for NP signature.

## 2023-10-24 ENCOUNTER — Other Ambulatory Visit (HOSPITAL_COMMUNITY): Payer: Self-pay

## 2023-10-24 NOTE — Telephone Encounter (Signed)
 Received approval from [facility], pt will continue to be B/B. Auth#: [id number] (10/17/23-09/17/24)

## 2023-11-14 ENCOUNTER — Ambulatory Visit: Payer: BC Managed Care – PPO | Admitting: Adult Health

## 2023-11-18 ENCOUNTER — Other Ambulatory Visit: Payer: Self-pay

## 2023-11-21 ENCOUNTER — Encounter (INDEPENDENT_AMBULATORY_CARE_PROVIDER_SITE_OTHER): Payer: Self-pay

## 2023-11-24 ENCOUNTER — Encounter (INDEPENDENT_AMBULATORY_CARE_PROVIDER_SITE_OTHER): Payer: Self-pay

## 2023-11-29 ENCOUNTER — Ambulatory Visit (INDEPENDENT_AMBULATORY_CARE_PROVIDER_SITE_OTHER): Admitting: Family Medicine

## 2023-12-01 ENCOUNTER — Ambulatory Visit (INDEPENDENT_AMBULATORY_CARE_PROVIDER_SITE_OTHER): Admitting: Family Medicine

## 2023-12-13 ENCOUNTER — Other Ambulatory Visit: Payer: Self-pay

## 2023-12-13 ENCOUNTER — Other Ambulatory Visit (HOSPITAL_COMMUNITY): Payer: Self-pay

## 2023-12-15 ENCOUNTER — Ambulatory Visit: Admitting: Adult Health

## 2023-12-15 ENCOUNTER — Telehealth: Payer: Self-pay | Admitting: Pharmacist

## 2023-12-15 ENCOUNTER — Other Ambulatory Visit (HOSPITAL_COMMUNITY): Payer: Self-pay

## 2023-12-15 VITALS — BP 115/67 | HR 85

## 2023-12-15 DIAGNOSIS — G43709 Chronic migraine without aura, not intractable, without status migrainosus: Secondary | ICD-10-CM

## 2023-12-15 MED ORDER — QULIPTA 60 MG PO TABS
60.0000 mg | ORAL_TABLET | Freq: Every day | ORAL | 11 refills | Status: AC
  Filled 2023-12-15 – 2023-12-18 (×2): qty 30, 30d supply, fill #0
  Filled 2024-01-13: qty 30, 30d supply, fill #1
  Filled 2024-02-12: qty 30, 30d supply, fill #2
  Filled 2024-03-13: qty 30, 30d supply, fill #3
  Filled 2024-04-10: qty 30, 30d supply, fill #4
  Filled 2024-05-09 – 2024-05-22 (×2): qty 30, 30d supply, fill #5
  Filled 2024-06-18: qty 30, 30d supply, fill #6
  Filled 2024-07-18: qty 30, 30d supply, fill #7

## 2023-12-15 MED ORDER — ONABOTULINUMTOXINA 100 UNITS IJ SOLR
155.0000 [IU] | Freq: Once | INTRAMUSCULAR | Status: AC
  Administered 2023-12-15: 155 [IU] via INTRAMUSCULAR

## 2023-12-15 NOTE — Progress Notes (Signed)
 Update 12/15/2023 JM: returns for repeat botox  injection. Prior injection 08/17/2023. This is her 3rd injection. She continues to have benefit on Botox , usually about 5 per month, which is 75% reduction in migraines. She can have some worsening of migraines by the 3rd month.  Use of sumatriptan  which helps about 50% of the time, no benefit with naratriptan .  Discussed adjunctive therapy for further migraine management, will start Qulipta 60 mg daily.  She will continue sumatriptan  for rescue. Tolerated procedure well today.  Return in 3 months for repeat injection.   Tried/failed: Preventative: Propranolol , Labetalol , Metoprolol , fluoxetine , gabapentin, Emgality , Ajovy , Aimovig contraindicated d/t constipation Rescue: Sumatriptan , rizatriptan , naratriptan , Ubrelvy, Zofran , Percocet, prednisone      Consent Form Botulism Toxin Injection For Chronic Migraine    Reviewed orally with patient, additionally signature is on file:  Botulism toxin has been approved by the Federal drug administration for treatment of chronic migraine. Botulism toxin does not cure chronic migraine and it may not be effective in some patients.  The administration of botulism toxin is accomplished by injecting a small amount of toxin into the muscles of the neck and head. Dosage must be titrated for each individual. Any benefits resulting from botulism toxin tend to wear off after 3 months with a repeat injection required if benefit is to be maintained. Injections are usually done every 3-4 months with maximum effect peak achieved by about 2 or 3 weeks. Botulism toxin is expensive and you should be sure of what costs you will incur resulting from the injection.  The side effects of botulism toxin use for chronic migraine may include:   -Transient, and usually mild, facial weakness with facial injections  -Transient, and usually mild, head or neck weakness with head/neck injections  -Reduction or loss of forehead  facial animation due to forehead muscle weakness  -Eyelid drooping  -Dry eye  -Pain at the site of injection or bruising at the site of injection  -Double vision  -Potential unknown long term risks   Contraindications: You should not have Botox  if you are pregnant, nursing, allergic to albumin, have an infection, skin condition, or muscle weakness at the site of the injection, or have myasthenia gravis, Lambert-Eaton syndrome, or ALS.  It is also possible that as with any injection, there may be an allergic reaction or no effect from the medication. Reduced effectiveness after repeated injections is sometimes seen and rarely infection at the injection site may occur. All care will be taken to prevent these side effects. If therapy is given over a long time, atrophy and wasting in the muscle injected may occur. Occasionally the patient's become refractory to treatment because they develop antibodies to the toxin. In this event, therapy needs to be modified.  I have read the above information and consent to the administration of botulism toxin.    BOTOX  PROCEDURE NOTE FOR MIGRAINE HEADACHE  Contraindications and precautions discussed with patient(above). Aseptic procedure was observed and patient tolerated procedure. Procedure performed by Johny Nap, AGNP-BC.   The condition has existed for more than 6 months, and pt does not have a diagnosis of ALS, Myasthenia Gravis or Lambert-Eaton Syndrome.  Risks and benefits of injections discussed and pt agrees to proceed with the procedure.  Written consent obtained  These injections are medically necessary. Pt  receives good benefits from these injections. These injections do not cause sedations or hallucinations which the oral therapies may cause.   Description of procedure:  The patient was placed in a  sitting position. The standard protocol was used for Botox  as follows, with 5 units of Botox  injected at each site:  -Procerus muscle,  midline injection  -Corrugator muscle, bilateral injection  -Frontalis muscle, bilateral injection, with 2 sites each side, medial injection was performed in the upper one third of the frontalis muscle, in the region vertical from the medial inferior edge of the superior orbital rim. The lateral injection was again in the upper one third of the forehead vertically above the lateral limbus of the cornea, 1.5 cm lateral to the medial injection site.  -Temporalis muscle injection, 4 sites, bilaterally. The first injection was 3 cm above the tragus of the ear, second injection site was 1.5 cm to 3 cm up from the first injection site in line with the tragus of the ear. The third injection site was 1.5-3 cm forward between the first 2 injection sites. The fourth injection site was 1.5 cm posterior to the second injection site. 5th site laterally in the temporalis  muscleat the level of the outer canthus.  -Occipitalis muscle injection, 3 sites, bilaterally. The first injection was done one half way between the occipital protuberance and the tip of the mastoid process behind the ear. The second injection site was done lateral and superior to the first, 1 fingerbreadth from the first injection. The third injection site was 1 fingerbreadth superiorly and medially from the first injection site.  -Cervical paraspinal muscle injection, 2 sites, bilaterally. The first injection site was 1 cm from the midline of the cervical spine, 3 cm inferior to the lower border of the occipital protuberance. The second injection site was 1.5 cm superiorly and laterally to the first injection site.  -Trapezius muscle injection was performed at 3 sites, bilaterally. The first injection site was in the upper trapezius muscle halfway between the inflection point of the neck, and the acromion. The second injection site was one half way between the acromion and the first injection site. The third injection was done between the first  injection site and the inflection point of the neck.    A total of 200 units of Botox  was prepared, 155 units of Botox  was injected as documented above, any Botox  not injected was wasted. The patient tolerated the procedure well, there were no complications of the above procedure.   Johny Nap, AGNP-BC  [facility] [address] [place], [place] [address]  Phone [phone] Fax [phone] Note: This document was prepared with digital dictation and possible smart phrase technology. Any transcriptional errors that result from this process are unintentional.

## 2023-12-15 NOTE — Progress Notes (Signed)
 Botox - 100 units x 2 vials Lot: [id number] Expiration: 05/03/2026 NDC: 4540-9811-91  Bacteriostatic 0.9% Sodium Chloride- 4 mL  Lot: [id number] Expiration: 03/05/2024 NDC: 6213-0865-78  Dx: I69.629 B/B Witnessed by Vassie Gentry, RN

## 2023-12-15 NOTE — Telephone Encounter (Signed)
 Pharmacy Patient Advocate Encounter  Received notification from [facility] that Prior Authorization for Qulipta 30MG  tablets has been APPROVED from 12/15/2023 to 03/08/2024   PA #/Case ID/Reference #: [id number]

## 2023-12-15 NOTE — Telephone Encounter (Signed)
 Pharmacy Patient Advocate Encounter   Received notification from Patient Pharmacy that prior authorization for Qulipta 60MG  tablets is required/requested.   Insurance verification completed.   The patient is insured through [facility] .   Per test claim: PA required; PA submitted to above mentioned insurance via CoverMyMeds Key/confirmation #/EOC  [id number] Status is pending

## 2023-12-19 ENCOUNTER — Other Ambulatory Visit (HOSPITAL_COMMUNITY): Payer: Self-pay

## 2023-12-19 ENCOUNTER — Other Ambulatory Visit: Payer: Self-pay

## 2023-12-20 ENCOUNTER — Ambulatory Visit (INDEPENDENT_AMBULATORY_CARE_PROVIDER_SITE_OTHER): Admitting: Family Medicine

## 2023-12-26 ENCOUNTER — Other Ambulatory Visit (HOSPITAL_COMMUNITY): Payer: Self-pay

## 2023-12-29 ENCOUNTER — Ambulatory Visit (INDEPENDENT_AMBULATORY_CARE_PROVIDER_SITE_OTHER): Admitting: Family Medicine

## 2024-01-13 ENCOUNTER — Other Ambulatory Visit (HOSPITAL_COMMUNITY): Payer: Self-pay

## 2024-01-15 ENCOUNTER — Other Ambulatory Visit: Payer: Self-pay | Admitting: Family Medicine

## 2024-01-16 ENCOUNTER — Other Ambulatory Visit: Payer: Self-pay

## 2024-01-16 ENCOUNTER — Other Ambulatory Visit (HOSPITAL_COMMUNITY): Payer: Self-pay

## 2024-01-16 MED ORDER — SERTRALINE HCL 50 MG PO TABS
50.0000 mg | ORAL_TABLET | Freq: Every day | ORAL | 3 refills | Status: DC
  Filled 2024-01-16: qty 30, 30d supply, fill #0
  Filled 2024-02-12 – 2024-02-14 (×2): qty 30, 30d supply, fill #1
  Filled 2024-03-13 – 2024-03-15 (×2): qty 30, 30d supply, fill #2
  Filled 2024-04-10 – 2024-04-30 (×2): qty 30, 30d supply, fill #3

## 2024-02-12 ENCOUNTER — Other Ambulatory Visit (HOSPITAL_COMMUNITY): Payer: Self-pay

## 2024-02-13 ENCOUNTER — Other Ambulatory Visit (HOSPITAL_COMMUNITY): Payer: Self-pay

## 2024-02-13 ENCOUNTER — Other Ambulatory Visit: Payer: Self-pay

## 2024-02-14 ENCOUNTER — Other Ambulatory Visit (HOSPITAL_COMMUNITY): Payer: Self-pay

## 2024-02-14 ENCOUNTER — Other Ambulatory Visit: Payer: Self-pay

## 2024-02-22 ENCOUNTER — Other Ambulatory Visit (HOSPITAL_COMMUNITY): Payer: Self-pay

## 2024-02-22 ENCOUNTER — Encounter (HOSPITAL_COMMUNITY): Payer: Self-pay

## 2024-02-22 ENCOUNTER — Encounter (INDEPENDENT_AMBULATORY_CARE_PROVIDER_SITE_OTHER): Payer: Self-pay

## 2024-02-29 ENCOUNTER — Other Ambulatory Visit: Payer: Self-pay

## 2024-02-29 ENCOUNTER — Other Ambulatory Visit (HOSPITAL_COMMUNITY): Payer: Self-pay

## 2024-02-29 MED ORDER — LEVONORGESTREL-ETHINYL ESTRAD 0.1-20 MG-MCG PO TABS
1.0000 | ORAL_TABLET | Freq: Every day | ORAL | 0 refills | Status: DC
  Filled 2024-02-29: qty 84, 63d supply, fill #0

## 2024-03-12 ENCOUNTER — Telehealth: Payer: Self-pay | Admitting: Pharmacist

## 2024-03-12 ENCOUNTER — Other Ambulatory Visit (HOSPITAL_COMMUNITY): Payer: Self-pay

## 2024-03-12 NOTE — Telephone Encounter (Signed)
 Pharmacy Patient Advocate Encounter   Received notification from CoverMyMeds that prior authorization for Qulipta  60MG  tablets is required/requested.   Insurance verification completed.   The patient is insured through [facility] .   Per test claim: PA required; PA submitted to above mentioned insurance via Latent Key/confirmation #/EOC [id number] Status is pending

## 2024-03-13 ENCOUNTER — Ambulatory Visit: Admitting: Adult Health

## 2024-03-13 ENCOUNTER — Other Ambulatory Visit: Payer: Self-pay

## 2024-03-13 ENCOUNTER — Other Ambulatory Visit (HOSPITAL_COMMUNITY): Payer: Self-pay

## 2024-03-13 MED ORDER — ONABOTULINUMTOXINA 200 UNITS IJ SOLR: INTRAMUSCULAR | 1 refills | Status: AC

## 2024-03-13 NOTE — Telephone Encounter (Signed)
 Pharmacy Patient Advocate Encounter  Received notification from [facility] that Prior Authorization for QULIPTA  60 MG PO TABS has been APPROVED from 03/12/2024 to 03/12/2025   PA #/Case ID/Reference #: [id number]

## 2024-03-13 NOTE — Telephone Encounter (Signed)
 Received approval, please send rx to [facility] in [place]. Thank you!  Auth#: [id number] (03/13/24-02/12/25)

## 2024-03-13 NOTE — Telephone Encounter (Signed)
 Pt's appointment is 03/28/24. Completed BCBS PA form to see if I can switch her to [facility], faxed to [phone].

## 2024-03-13 NOTE — Addendum Note (Signed)
 Addended by: DELFINO AUGUSTIN BROCKS on: 03/13/2024 12:55 PM   Modules accepted: Orders

## 2024-03-26 DIAGNOSIS — G43709 Chronic migraine without aura, not intractable, without status migrainosus: Secondary | ICD-10-CM | POA: Diagnosis not present

## 2024-03-28 ENCOUNTER — Ambulatory Visit: Admitting: Adult Health

## 2024-03-28 VITALS — BP 99/65 | HR 70

## 2024-03-28 DIAGNOSIS — G43709 Chronic migraine without aura, not intractable, without status migrainosus: Secondary | ICD-10-CM

## 2024-03-28 MED ORDER — ONABOTULINUMTOXINA 200 UNITS IJ SOLR
155.0000 [IU] | Freq: Once | INTRAMUSCULAR | Status: AC
  Administered 2024-03-28: 155 [IU] via INTRAMUSCULAR

## 2024-03-28 NOTE — Progress Notes (Signed)
 Update 03/28/2024 JM: returns for repeat botox  injection. Prior injection 12/15/2023.  She reports overall great improvement of migraine headaches with ongoing use of Botox  and after initiating Qulipta .  She can have worsening a couple weeks prior to next injection but otherwise having about 1 migraine per month which is >50% reduction in migraine severity and frequency.  Uses sumatriptan  with benefit.     Consent Form Botulism Toxin Injection For Chronic Migraine    Reviewed orally with patient, additionally signature is on file:  Botulism toxin has been approved by the Federal drug administration for treatment of chronic migraine. Botulism toxin does not cure chronic migraine and it may not be effective in some patients.  The administration of botulism toxin is accomplished by injecting a small amount of toxin into the muscles of the neck and head. Dosage must be titrated for each individual. Any benefits resulting from botulism toxin tend to wear off after 3 months with a repeat injection required if benefit is to be maintained. Injections are usually done every 3-4 months with maximum effect peak achieved by about 2 or 3 weeks. Botulism toxin is expensive and you should be sure of what costs you will incur resulting from the injection.  The side effects of botulism toxin use for chronic migraine may include:   -Transient, and usually mild, facial weakness with facial injections  -Transient, and usually mild, head or neck weakness with head/neck injections  -Reduction or loss of forehead facial animation due to forehead muscle weakness  -Eyelid drooping  -Dry eye  -Pain at the site of injection or bruising at the site of injection  -Double vision  -Potential unknown long term risks   Contraindications: You should not have Botox  if you are pregnant, nursing, allergic to albumin, have an infection, skin condition, or muscle weakness at the site of the injection, or have myasthenia  gravis, Lambert-Eaton syndrome, or ALS.  It is also possible that as with any injection, there may be an allergic reaction or no effect from the medication. Reduced effectiveness after repeated injections is sometimes seen and rarely infection at the injection site may occur. All care will be taken to prevent these side effects. If therapy is given over a long time, atrophy and wasting in the muscle injected may occur. Occasionally the patient's become refractory to treatment because they develop antibodies to the toxin. In this event, therapy needs to be modified.  I have read the above information and consent to the administration of botulism toxin.    BOTOX  PROCEDURE NOTE FOR MIGRAINE HEADACHE  Contraindications and precautions discussed with patient(above). Aseptic procedure was observed and patient tolerated procedure. Procedure performed by Harlene Bogaert, AGNP-BC.   The condition has existed for more than 6 months, and pt does not have a diagnosis of ALS, Myasthenia Gravis or Lambert-Eaton Syndrome.  Risks and benefits of injections discussed and pt agrees to proceed with the procedure.  Written consent obtained  These injections are medically necessary. Pt  receives good benefits from these injections. These injections do not cause sedations or hallucinations which the oral therapies may cause.   Description of procedure:  The patient was placed in a sitting position. The standard protocol was used for Botox  as follows, with 5 units of Botox  injected at each site:  -Procerus muscle, midline injection  -Corrugator muscle, bilateral injection  -Frontalis muscle, bilateral injection, with 2 sites each side, medial injection was performed in the upper one third of the frontalis muscle,  in the region vertical from the medial inferior edge of the superior orbital rim. The lateral injection was again in the upper one third of the forehead vertically above the lateral limbus of the cornea,  1.5 cm lateral to the medial injection site.  -Temporalis muscle injection, 4 sites, bilaterally. The first injection was 3 cm above the tragus of the ear, second injection site was 1.5 cm to 3 cm up from the first injection site in line with the tragus of the ear. The third injection site was 1.5-3 cm forward between the first 2 injection sites. The fourth injection site was 1.5 cm posterior to the second injection site. 5th site laterally in the temporalis  muscleat the level of the outer canthus.  -Occipitalis muscle injection, 3 sites, bilaterally. The first injection was done one half way between the occipital protuberance and the tip of the mastoid process behind the ear. The second injection site was done lateral and superior to the first, 1 fingerbreadth from the first injection. The third injection site was 1 fingerbreadth superiorly and medially from the first injection site.  -Cervical paraspinal muscle injection, 2 sites, bilaterally. The first injection site was 1 cm from the midline of the cervical spine, 3 cm inferior to the lower border of the occipital protuberance. The second injection site was 1.5 cm superiorly and laterally to the first injection site.  -Trapezius muscle injection was performed at 3 sites, bilaterally. The first injection site was in the upper trapezius muscle halfway between the inflection point of the neck, and the acromion. The second injection site was one half way between the acromion and the first injection site. The third injection was done between the first injection site and the inflection point of the neck.    A total of 200 units of Botox  was prepared, 155 units of Botox  was injected as documented above, any Botox  not injected was wasted. The patient tolerated the procedure well, there were no complications of the above procedure.   Harlene Bogaert, AGNP-BC  [facility] [address] [place], [place] [address]  Phone  [phone] Fax [phone] Note: This document was prepared with digital dictation and possible smart phrase technology. Any transcriptional errors that result from this process are unintentional.

## 2024-03-28 NOTE — Progress Notes (Signed)
 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

## 2024-04-10 ENCOUNTER — Other Ambulatory Visit (HOSPITAL_COMMUNITY): Payer: Self-pay

## 2024-04-10 ENCOUNTER — Other Ambulatory Visit: Payer: Self-pay

## 2024-04-13 ENCOUNTER — Other Ambulatory Visit: Payer: Self-pay

## 2024-04-13 ENCOUNTER — Other Ambulatory Visit (HOSPITAL_COMMUNITY): Payer: Self-pay

## 2024-04-13 DIAGNOSIS — Z01419 Encounter for gynecological examination (general) (routine) without abnormal findings: Secondary | ICD-10-CM | POA: Diagnosis not present

## 2024-04-13 MED ORDER — DROSPIRENONE-ETHINYL ESTRADIOL 3-0.03 MG PO TABS
1.0000 | ORAL_TABLET | Freq: Every day | ORAL | 4 refills | Status: AC
  Filled 2024-04-13: qty 84, 63d supply, fill #0
  Filled 2024-06-13: qty 84, 63d supply, fill #1

## 2024-04-30 ENCOUNTER — Other Ambulatory Visit (HOSPITAL_COMMUNITY): Payer: Self-pay

## 2024-05-09 ENCOUNTER — Other Ambulatory Visit (HOSPITAL_BASED_OUTPATIENT_CLINIC_OR_DEPARTMENT_OTHER): Payer: Self-pay

## 2024-05-22 ENCOUNTER — Other Ambulatory Visit: Payer: Self-pay | Admitting: Adult Health

## 2024-05-23 ENCOUNTER — Other Ambulatory Visit (HOSPITAL_COMMUNITY): Payer: Self-pay

## 2024-05-23 ENCOUNTER — Other Ambulatory Visit: Payer: Self-pay

## 2024-05-23 MED ORDER — SUMATRIPTAN SUCCINATE 100 MG PO TABS
100.0000 mg | ORAL_TABLET | ORAL | 11 refills | Status: AC | PRN
  Filled 2024-05-23: qty 9, 21d supply, fill #0

## 2024-05-24 ENCOUNTER — Ambulatory Visit: Admitting: Family Medicine

## 2024-05-24 ENCOUNTER — Other Ambulatory Visit: Payer: Self-pay

## 2024-05-24 ENCOUNTER — Other Ambulatory Visit (HOSPITAL_COMMUNITY): Payer: Self-pay

## 2024-05-24 ENCOUNTER — Encounter: Payer: BC Managed Care – PPO | Admitting: Family Medicine

## 2024-05-24 VITALS — BP 111/72 | HR 84 | Ht 63.0 in | Wt 205.0 lb

## 2024-05-24 DIAGNOSIS — Z0000 Encounter for general adult medical examination without abnormal findings: Secondary | ICD-10-CM | POA: Diagnosis not present

## 2024-05-24 DIAGNOSIS — R000 Tachycardia, unspecified: Secondary | ICD-10-CM | POA: Diagnosis not present

## 2024-05-24 MED ORDER — SERTRALINE HCL 50 MG PO TABS
50.0000 mg | ORAL_TABLET | Freq: Every day | ORAL | 3 refills | Status: AC
  Filled 2024-05-24: qty 30, 30d supply, fill #0
  Filled 2024-06-13 – 2024-06-25 (×2): qty 30, 30d supply, fill #1
  Filled 2024-07-25: qty 30, 30d supply, fill #2

## 2024-05-24 MED ORDER — PROPRANOLOL HCL 10 MG PO TABS
10.0000 mg | ORAL_TABLET | Freq: Two times a day (BID) | ORAL | 3 refills | Status: AC
  Filled 2024-05-24 – 2024-06-13 (×2): qty 180, 90d supply, fill #0

## 2024-05-24 NOTE — Progress Notes (Signed)
 Complete physical exam  Patient: Jean Phillips   DOB: [birth year]   33 y.o. Female  MRN: [id number]  Subjective:    Chief Complaint  Patient presents with   Annual Exam    Jean Phillips is a 33 y.o. female who presents today for a complete physical exam. She reports consuming a general diet. The patient does not participate in regular exercise at present. She generally feels well. She reports sleeping poorly. She does not have additional problems to discuss today.   Currently lives with: husband, son  Acute concerns or interim problems since last visit: no  Chronic Problems   Mood follow-up: - Diagnosis: anxiety and depression - Treatment: Zoloft  50 mg daily and Hydroxyzine  5- 20 mg as needed. - Medication side effects: no - SI/HI: no - Update: Doing well.    Migraines: - Treatment: Botox  155 units and Sumatriptan  100 mg as needed. Doing well overall.   Vision concerns: no Dental concerns: no STD concerns: no  ETOH use: no Nicotine use: no Recreational drugs/illegal substances: no  Females:  She is currently  sexually active  Contraception choices are: OCP       Most recent fall risk assessment:    05/24/2024    1:26 PM  Fall Risk   Falls in the past year? 0  Number falls in past yr: 0  Injury with Fall? 0  Risk for fall due to : No Fall Risks  Follow up Falls evaluation completed     Most recent depression screenings:    05/24/2024    1:26 PM 08/18/2022   11:46 AM  PHQ 2/9 Scores  PHQ - 2 Score 2 2  PHQ- 9 Score 11 5      Data saved with a previous flowsheet row definition      05/24/2024    1:26 PM 08/18/2022   11:46 AM 07/08/2022    9:45 AM  GAD 7 : Generalized Anxiety Score  Nervous, Anxious, on Edge 1 1 1   Control/stop worrying 1 1 2   Worry too much - different things 1 0 1  Trouble relaxing 2 1 2   Restless 2 0 1  Easily annoyed or irritable 2 1 2   Afraid - awful might happen 1 1 1   Total GAD 7 Score 10 5 10   Anxiety Difficulty  Somewhat difficult Not difficult at all Somewhat difficult             Patient Care Team: Domenica Harlene LABOR, MD as PCP - General (Family Medicine) Shlomo Wilbert SAUNDERS, MD as PCP - Cardiology (Cardiology) Lilton Legions, DO as Consulting Physician (Obstetrics and Gynecology) Nahser, Aleene PARAS, MD (Inactive) as Consulting Physician (Cardiology)   Outpatient Medications Prior to Visit  Medication Sig   Atogepant  (QULIPTA ) 60 MG TABS Take 1 tablet (60 mg total) by mouth daily.   botulinum toxin Type A  (BOTOX ) 200 units injection Inject 155 units into face and neck muscles.   drospirenone-ethinyl estradiol  (YASMIN 28) 3-0.03 MG tablet Take 1 tablet by mouth daily. WIll take continously.   hydrOXYzine  (ATARAX ) 10 MG tablet Take 0.5-2 tablets (5-20 mg total) by mouth 3 (three) times daily as needed.   Multiple Vitamin (MULTIVITAMIN PO) Take by mouth daily.   promethazine  (PHENERGAN ) 25 MG tablet Take 0.5-1 tablets (12.5-25 mg total) by mouth every 6 (six) hours as needed for nausea and vomiting.   SUMAtriptan  (IMITREX ) 100 MG tablet Take 1 tablet (100 mg total) by mouth as needed for migraine.  May repeat in 2  hours if headache persists or recurs.   [DISCONTINUED] levonorgestrel -ethinyl estradiol  (VIENVA ) 0.1-20 MG-MCG tablet Take 1 tablet by mouth daily (continuously)   [DISCONTINUED] propranolol  (INDERAL ) 10 MG tablet Take 1 tablet (10 mg total) by mouth 2 (two) times daily.   [DISCONTINUED] sertraline  (ZOLOFT ) 50 MG tablet Take 1 tablet (50 mg total) by mouth daily.   No facility-administered medications prior to visit.    ROS All review of systems negative except what is listed in the HPI        Objective:     BP 111/72   Pulse 84   Ht 5' 3 (1.6 m)   Wt 205 lb (93 kg)   SpO2 99%   BMI 36.31 kg/m    Physical Exam Vitals reviewed.  Constitutional:      General: She is not in acute distress.    Appearance: Normal appearance. She is not ill-appearing.  HENT:     Head:  Normocephalic and atraumatic.     Right Ear: Tympanic membrane normal.     Left Ear: Tympanic membrane normal.     Nose: Nose normal.     Mouth/Throat:     Mouth: Mucous membranes are moist.     Pharynx: Oropharynx is clear.  Eyes:     Extraocular Movements: Extraocular movements intact.     Conjunctiva/sclera: Conjunctivae normal.     Pupils: Pupils are equal, round, and reactive to light.  Cardiovascular:     Rate and Rhythm: Normal rate and regular rhythm.     Pulses: Normal pulses.     Heart sounds: Normal heart sounds.  Pulmonary:     Effort: Pulmonary effort is normal.     Breath sounds: Normal breath sounds.  Abdominal:     General: Abdomen is flat. Bowel sounds are normal. There is no distension.     Palpations: Abdomen is soft. There is no mass.     Tenderness: There is no abdominal tenderness. There is no right CVA tenderness, left CVA tenderness, guarding or rebound.  Genitourinary:    Comments: Deferred exam Musculoskeletal:        General: Normal range of motion.     Cervical back: Normal range of motion and neck supple. No tenderness.     Right lower leg: No edema.     Left lower leg: No edema.  Lymphadenopathy:     Cervical: No cervical adenopathy.  Skin:    General: Skin is warm and dry.     Capillary Refill: Capillary refill takes less than 2 seconds.  Neurological:     General: No focal deficit present.     Mental Status: She is alert and oriented to person, place, and time. Mental status is at baseline.  Psychiatric:        Mood and Affect: Mood normal.        Behavior: Behavior normal.        Thought Content: Thought content normal.        Judgment: Judgment normal.      No results found for any visits on 05/24/24.     Assessment & Plan:    Routine Health Maintenance and Physical Exam Discussed health promotion and safety including diet and exercise recommendations, dental health, and injury prevention. Tobacco cessation if applicable. Seat  belts, sunscreen, smoke detectors, etc.    Immunization History  Administered Date(s) Administered   Influenza Inj Mdck Quad With Preservative 04/04/2017   Influenza,inj,Quad PF,6+ Mos 06/20/2015   Influenza-Unspecified 03/05/2016, 03/09/2018, 03/05/2020, 04/05/2022, 03/30/2024  PFIZER(Purple Top)SARS-COV-2 Vaccination 04/01/2020, 04/22/2020   Tdap 03/27/2015    Health Maintenance  Topic Date Due   Hepatitis C Screening  Never done   COVID-19 Vaccine (3 - 2025-26 season) 05/23/2025 (Originally 03/05/2024)   Hepatitis B Vaccines 19-59 Average Risk (1 of 3 - 19+ 3-dose series) 05/24/2025 (Originally 09/24/2011)   HPV VACCINES (1 - 3-dose SCDM series) 05/24/2025 (Originally 09/24/2019)   DTaP/Tdap/Td (2 - Td or Tdap) 03/26/2025   Cervical Cancer Screening (HPV/Pap Cotest)  04/05/2026   Influenza Vaccine  Completed   HIV Screening  Completed   Pneumococcal Vaccine  Aged Out   Meningococcal B Vaccine  Aged Out        Problem List Items Addressed This Visit       Active Problems   Sinus tachycardia   Relevant Medications   propranolol  (INDERAL ) 10 MG tablet   Other Visit Diagnoses       Annual physical exam    -  Primary Doing well. No concerns.  Declined labs today. Stable earlier this year.           PATIENT COUNSELING:   Advised to take 1 mg of folate supplement per day if capable of pregnancy.  - Recommend that most people either abstain from alcohol or drink within safe limits (<=14/week and <=4 drinks/occasion for males, <=7/weeks and <= 3 drinks/occasion for females) and that the risk for alcohol disorders and other health effects rises proportionally with the number of drinks per week and how often a drinker exceeds daily limits.  Diet: Recommend to adjust caloric intake to maintain or achieve ideal body weight, to reduce intake of dietary saturated fat and total fat, to limit sodium intake by avoiding high sodium foods and not adding table salt, and to maintain  adequate dietary potassium and calcium preferably from fresh fruits, vegetables, and low-fat dairy products.   Emphasized the importance of regular exercise.  Injury prevention: Recommend seatbelts, safety helmets, smoke detector, etc..   Dental health: Recommend regular tooth brushing, flossing, and dental visits.       Return in about 6 months (around 11/21/2024) for chronic disease management.     Waddell KATHEE Mon, NP  I,Emily Lagle,acting as a scribe for Waddell KATHEE Mon, NP.,have documented all relevant documentation on the behalf of Waddell KATHEE Mon, NP.  I, Waddell KATHEE Mon, NP, have reviewed all documentation for this visit. The documentation on 05/24/2024 for the exam, diagnosis, procedures, and orders are all accurate and complete.

## 2024-05-25 ENCOUNTER — Other Ambulatory Visit: Payer: Self-pay

## 2024-05-28 ENCOUNTER — Other Ambulatory Visit (HOSPITAL_COMMUNITY): Payer: Self-pay

## 2024-06-13 ENCOUNTER — Other Ambulatory Visit (HOSPITAL_COMMUNITY): Payer: Self-pay

## 2024-06-14 ENCOUNTER — Other Ambulatory Visit: Payer: Self-pay

## 2024-06-19 ENCOUNTER — Other Ambulatory Visit: Payer: Self-pay

## 2024-06-20 ENCOUNTER — Ambulatory Visit: Admitting: Adult Health

## 2024-06-20 ENCOUNTER — Encounter: Payer: Self-pay | Admitting: Adult Health

## 2024-06-20 VITALS — BP 117/72 | HR 65

## 2024-06-20 DIAGNOSIS — G43709 Chronic migraine without aura, not intractable, without status migrainosus: Secondary | ICD-10-CM

## 2024-06-20 MED ORDER — ONABOTULINUMTOXINA 200 UNITS IJ SOLR
155.0000 [IU] | Freq: Once | INTRAMUSCULAR | Status: AC
  Administered 2024-06-20: 13:00:00 155 [IU] via INTRAMUSCULAR

## 2024-06-20 NOTE — Progress Notes (Signed)
 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

## 2024-06-20 NOTE — Progress Notes (Signed)
 Update 06/20/2024 JM: returns for repeat botox  injection. Prior injection 03/28/2024.  Migraines remain well-managed with ongoing Botox  and Qulipta .  She has approximately 3 migraines per month which is >50% reduction in migraine severity and frequency.  Uses sumatriptan  with benefit.  Tolerated procedure well today.  Return in 3 months for repeat injection.     Consent Form Botulism Toxin Injection For Chronic Migraine    Reviewed orally with patient, additionally signature is on file:  Botulism toxin has been approved by the Federal drug administration for treatment of chronic migraine. Botulism toxin does not cure chronic migraine and it may not be effective in some patients.  The administration of botulism toxin is accomplished by injecting a small amount of toxin into the muscles of the neck and head. Dosage must be titrated for each individual. Any benefits resulting from botulism toxin tend to wear off after 3 months with a repeat injection required if benefit is to be maintained. Injections are usually done every 3-4 months with maximum effect peak achieved by about 2 or 3 weeks. Botulism toxin is expensive and you should be sure of what costs you will incur resulting from the injection.  The side effects of botulism toxin use for chronic migraine may include:   -Transient, and usually mild, facial weakness with facial injections  -Transient, and usually mild, head or neck weakness with head/neck injections  -Reduction or loss of forehead facial animation due to forehead muscle weakness  -Eyelid drooping  -Dry eye  -Pain at the site of injection or bruising at the site of injection  -Double vision  -Potential unknown long term risks   Contraindications: You should not have Botox  if you are pregnant, nursing, allergic to albumin, have an infection, skin condition, or muscle weakness at the site of the injection, or have myasthenia gravis, Lambert-Eaton syndrome, or  ALS.  It is also possible that as with any injection, there may be an allergic reaction or no effect from the medication. Reduced effectiveness after repeated injections is sometimes seen and rarely infection at the injection site may occur. All care will be taken to prevent these side effects. If therapy is given over a long time, atrophy and wasting in the muscle injected may occur. Occasionally the patient's become refractory to treatment because they develop antibodies to the toxin. In this event, therapy needs to be modified.  I have read the above information and consent to the administration of botulism toxin.    BOTOX  PROCEDURE NOTE FOR MIGRAINE HEADACHE  Contraindications and precautions discussed with patient(above). Aseptic procedure was observed and patient tolerated procedure. Procedure performed by Harlene Bogaert, AGNP-BC.   The condition has existed for more than 6 months, and pt does not have a diagnosis of ALS, Myasthenia Gravis or Lambert-Eaton Syndrome.  Risks and benefits of injections discussed and pt agrees to proceed with the procedure.  Written consent obtained  These injections are medically necessary. Pt  receives good benefits from these injections. These injections do not cause sedations or hallucinations which the oral therapies may cause.   Description of procedure:  The patient was placed in a sitting position. The standard protocol was used for Botox  as follows, with 5 units of Botox  injected at each site:  -Procerus muscle, midline injection  -Corrugator muscle, bilateral injection  -Frontalis muscle, bilateral injection, with 2 sites each side, medial injection was performed in the upper one third of the frontalis muscle, in the region vertical from the medial inferior  edge of the superior orbital rim. The lateral injection was again in the upper one third of the forehead vertically above the lateral limbus of the cornea, 1.5 cm lateral to the medial  injection site.  -Temporalis muscle injection, 4 sites, bilaterally. The first injection was 3 cm above the tragus of the ear, second injection site was 1.5 cm to 3 cm up from the first injection site in line with the tragus of the ear. The third injection site was 1.5-3 cm forward between the first 2 injection sites. The fourth injection site was 1.5 cm posterior to the second injection site. 5th site laterally in the temporalis  muscleat the level of the outer canthus.  -Occipitalis muscle injection, 3 sites, bilaterally. The first injection was done one half way between the occipital protuberance and the tip of the mastoid process behind the ear. The second injection site was done lateral and superior to the first, 1 fingerbreadth from the first injection. The third injection site was 1 fingerbreadth superiorly and medially from the first injection site.  -Cervical paraspinal muscle injection, 2 sites, bilaterally. The first injection site was 1 cm from the midline of the cervical spine, 3 cm inferior to the lower border of the occipital protuberance. The second injection site was 1.5 cm superiorly and laterally to the first injection site.  -Trapezius muscle injection was performed at 3 sites, bilaterally. The first injection site was in the upper trapezius muscle halfway between the inflection point of the neck, and the acromion. The second injection site was one half way between the acromion and the first injection site. The third injection was done between the first injection site and the inflection point of the neck.    A total of 200 units of Botox  was prepared, 155 units of Botox  was injected as documented above, any Botox  not injected was wasted. The patient tolerated the procedure well, there were no complications of the above procedure.   Harlene Bogaert, AGNP-BC  [facility] [address] [place], [place] [address]  Phone [phone] Fax  [phone] Note: This document was prepared with digital dictation and possible smart phrase technology. Any transcriptional errors that result from this process are unintentional.

## 2024-06-25 ENCOUNTER — Other Ambulatory Visit (HOSPITAL_COMMUNITY): Payer: Self-pay

## 2024-06-25 DIAGNOSIS — G43709 Chronic migraine without aura, not intractable, without status migrainosus: Secondary | ICD-10-CM | POA: Diagnosis not present

## 2024-07-03 ENCOUNTER — Telehealth: Admitting: Physician Assistant

## 2024-07-03 DIAGNOSIS — R509 Fever, unspecified: Secondary | ICD-10-CM

## 2024-07-03 NOTE — Progress Notes (Signed)
" ° °  Thank you for the details you included in the comment boxes. Those details are very helpful in determining the best course of treatment for you and help us  to provide the best care.Because of recurrent fever, we recommend that you schedule a Virtual Urgent Care video visit in order for the provider to better assess what is going on.  The provider will be able to give you a more accurate diagnosis and treatment plan if we can more freely discuss your symptoms and with the addition of a virtual examination.   If you change your visit to a video visit, we will bill your insurance (similar to an office visit) and you will not be charged for this e-Visit. You will be able to stay at home and speak with the first available [facility] advanced practice provider. The link to do a video visit is in the drop down Menu tab of your Welcome screen in MyChart.    "

## 2024-07-18 ENCOUNTER — Other Ambulatory Visit: Payer: Self-pay

## 2024-07-25 ENCOUNTER — Other Ambulatory Visit: Payer: Self-pay

## 2024-09-19 ENCOUNTER — Ambulatory Visit: Admitting: Adult Health

## 2024-11-29 ENCOUNTER — Ambulatory Visit: Admitting: Family Medicine
